# Patient Record
Sex: Female | Born: 1965 | Race: Black or African American | Hispanic: No | Smoking: Never smoker
Health system: Southern US, Community
[De-identification: ages and names within clinical notes are randomized; demographics above are authoritative.]

## PROBLEM LIST (undated history)

## (undated) DIAGNOSIS — R160 Hepatomegaly, not elsewhere classified: Secondary | ICD-10-CM

## (undated) DIAGNOSIS — M1990 Unspecified osteoarthritis, unspecified site: Secondary | ICD-10-CM

## (undated) DIAGNOSIS — Z87442 Personal history of urinary calculi: Secondary | ICD-10-CM

## (undated) DIAGNOSIS — C50919 Malignant neoplasm of unspecified site of unspecified female breast: Secondary | ICD-10-CM

## (undated) DIAGNOSIS — N189 Chronic kidney disease, unspecified: Secondary | ICD-10-CM

## (undated) DIAGNOSIS — C801 Malignant (primary) neoplasm, unspecified: Secondary | ICD-10-CM

## (undated) DIAGNOSIS — F419 Anxiety disorder, unspecified: Secondary | ICD-10-CM

## (undated) HISTORY — DX: Malignant (primary) neoplasm, unspecified: C80.1

## (undated) HISTORY — DX: Malignant neoplasm of unspecified site of unspecified female breast: C50.919

## (undated) HISTORY — DX: Hepatomegaly, not elsewhere classified: R16.0

## (undated) HISTORY — PX: INTRAUTERINE DEVICE INSERTION: SHX323

---

## 2011-07-15 ENCOUNTER — Other Ambulatory Visit: Payer: Self-pay | Admitting: Obstetrics and Gynecology

## 2011-07-15 DIAGNOSIS — R921 Mammographic calcification found on diagnostic imaging of breast: Secondary | ICD-10-CM

## 2011-07-29 ENCOUNTER — Ambulatory Visit
Admission: RE | Admit: 2011-07-29 | Discharge: 2011-07-29 | Disposition: A | Discharge status: Home / Self Care | Payer: Managed Care, Other (non HMO) | Source: Ambulatory Visit | Attending: Obstetrics and Gynecology | Admitting: Obstetrics and Gynecology

## 2011-07-29 ENCOUNTER — Ambulatory Visit
Admission: RE | Admit: 2011-07-29 | Discharge: 2011-07-29 | Disposition: A | Discharge status: Home / Self Care | Payer: Commercial Indemnity | Source: Ambulatory Visit | Attending: Obstetrics and Gynecology | Admitting: Obstetrics and Gynecology

## 2011-07-29 ENCOUNTER — Other Ambulatory Visit: Payer: Self-pay | Admitting: Diagnostic Radiology

## 2011-07-29 DIAGNOSIS — R921 Mammographic calcification found on diagnostic imaging of breast: Secondary | ICD-10-CM

## 2011-07-31 ENCOUNTER — Other Ambulatory Visit: Payer: Self-pay | Admitting: Obstetrics and Gynecology

## 2011-07-31 DIAGNOSIS — C50912 Malignant neoplasm of unspecified site of left female breast: Secondary | ICD-10-CM

## 2011-08-05 ENCOUNTER — Ambulatory Visit
Admission: RE | Admit: 2011-08-05 | Discharge: 2011-08-05 | Disposition: A | Discharge status: Home / Self Care | Payer: Managed Care, Other (non HMO) | Source: Ambulatory Visit | Attending: Obstetrics and Gynecology | Admitting: Obstetrics and Gynecology

## 2011-08-05 DIAGNOSIS — C50912 Malignant neoplasm of unspecified site of left female breast: Secondary | ICD-10-CM

## 2011-08-05 MED ORDER — GADOBENATE DIMEGLUMINE 529 MG/ML IV SOLN
14.0000 mL | Freq: Once | INTRAVENOUS | Status: AC | PRN
  Administered 2011-08-05: 14 mL via INTRAVENOUS

## 2011-08-06 ENCOUNTER — Ambulatory Visit (INDEPENDENT_AMBULATORY_CARE_PROVIDER_SITE_OTHER): Payer: Managed Care, Other (non HMO) | Admitting: Surgery

## 2011-08-06 ENCOUNTER — Encounter (INDEPENDENT_AMBULATORY_CARE_PROVIDER_SITE_OTHER): Payer: Self-pay | Admitting: Surgery

## 2011-08-06 VITALS — BP 100/62 | HR 68 | Temp 97.4°F | Resp 18 | Ht 61.0 in | Wt 157.2 lb

## 2011-08-06 DIAGNOSIS — C50919 Malignant neoplasm of unspecified site of unspecified female breast: Secondary | ICD-10-CM

## 2011-08-06 DIAGNOSIS — C50912 Malignant neoplasm of unspecified site of left female breast: Secondary | ICD-10-CM

## 2011-08-06 DIAGNOSIS — C50412 Malignant neoplasm of upper-outer quadrant of left female breast: Secondary | ICD-10-CM | POA: Insufficient documentation

## 2011-08-06 NOTE — Progress Notes (Signed)
Patient ID: Samantha Mckay, female   DOB: Aug 06, 1965, 46 y.o.   MRN: [id number]  Chief Complaint  Patient presents with  . Other    Eval breast cancer   HPI Samantha Mckay is a 46 y.o. female.  Referred by Dr. 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 Her primary care physician is Dr. Alyce Pagan. HPI This is a healthy 46 year old female who recently underwent her first screening mammogram. This was performed at [facility] in [place]. She was called back in for magnification views on the left. She was then referred to the breast center of [facility] for stereotactic biopsy of suspicious microcalcifications in the left breast. 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 She underwent MRI on 1/9. In the left lower inner quadrant there is patchy, segmental and enhancement measuring 5.1 x 2.3 x 2.1 cm. The biopsy clip is located in the anterior one third of the enhancement. There is no sign of any axillary or internal mammary adenopathy. The right breast is normal. There is incidental finding of a 10.7 cm mass in the liver which may represent hemangioma versus cyst. She is now referred for surgical evaluation.  She denies any breast pain, tenderness, masses, or nipple discharge. Menarche - age 72 1st pregnancy - age 62 Breastfeed - yes Hormones - yes  Past Medical History  Diagnosis Date  . Cancer     breast    History reviewed. No pertinent past surgical history. IUD in place  Family History  Problem Relation Age of Onset  . Cancer Mother     stomach    Social History History  Substance Use Topics  . Smoking status: Never Smoker   . Smokeless tobacco: Never Used  . Alcohol Use: No    No Known Allergies  Current Outpatient Prescriptions  Medication Sig Dispense Refill  . cholecalciferol (VITAMIN D) 1000 UNITS tablet Take 1,000 Units by mouth 2 (two) times daily.      . Multiple Vitamin (MULTIVITAMIN)  tablet Take 1 tablet by mouth daily.        Review of Systems Review of Systems  Constitutional: Negative for fever, chills and unexpected weight change.  HENT: Negative for hearing loss, congestion, sore throat, trouble swallowing and voice change.   Eyes: Negative for visual disturbance.  Respiratory: Negative for cough and wheezing.   Cardiovascular: Negative for chest pain, palpitations and leg swelling.  Gastrointestinal: Negative for nausea, vomiting, abdominal pain, diarrhea, constipation, blood in stool, abdominal distention and anal bleeding.  Genitourinary: Negative for hematuria, vaginal bleeding and difficulty urinating.  Musculoskeletal: Negative for arthralgias.  Skin: Negative for rash and wound.  Neurological: Negative for seizures, syncope and headaches.  Hematological: Negative for adenopathy. Does not bruise/bleed easily.  Psychiatric/Behavioral: Negative for confusion.    Blood pressure 100/62, pulse 68, temperature 97.4 F (36.3 C), temperature source Temporal, resp. rate 18, height 5\' 1"  (1.549 m), weight 157 lb 3.2 oz (71.305 kg).  Physical Exam Physical Exam WDWN in NAD HEENT:  EOMI, sclera anicteric Neck:  No masses, no thyromegaly Lungs:  CTA bilaterally; normal respiratory effort CV:  Regular rate and rhythm; no murmurs Breasts - symmetric; no axillary or supraclavicular lymphadenopathy; no palpable masses in the right breast; left breast - lower inner quadrant just outside the areola - 2 cm area of firmness under the previous biopsy site - this may represent residual hematoma Abd:  +bowel sounds, soft, non-tender, no masses Ext:  Well-perfused; no  edema Skin:  Warm, dry; no sign of jaundice  Data Reviewed Mammogram/ path report/ MRI  Assessment    DCIS left breast in an area of microcalcifications/ patchy enhancement measuring 5.1 x 2.3 x 2.1 cm The biopsy was in the anterior one third of the area of enhancement Liver mass - 10.7 cm - could  represent hemangioma vs cyst; need to evaluate further    Plan    Liver MRI to rule out liver metastases We discussed options for surgical treatment. We discussed breast conserving therapy with a lumpectomy, sentinel lymph node biopsy followed by a course of radiation with possible further adjuvant treatment. Breast conserving therapy might be difficult to have the entire area of microcalcifications were excised. This area measures 5.1 cm in greatest dimension and a lumpectomy with negative margins may leave a less than desirable cosmetic outcome. We also discussed possible mastectomy with possible immediate reconstruction. Even prior to her visit the patient stated that she was leaning in this direction.  We will proceed with her liver MRI. We will also refer her to plastic surgery for consultation for possible immediate reconstruction. If we choose to pursue breast conserving therapy and then we will need to biopsy the posterior portion of these calcifications to determine the extent of the disease. We will reevaluate the patient in one to 2 weeks to discuss further options       Adem Costlow K. 08/06/2011, 12:49 PM

## 2011-08-06 NOTE — Patient Instructions (Signed)
Follow-up in 1-2 weeks to discuss further treatment options.

## 2011-08-07 ENCOUNTER — Other Ambulatory Visit (INDEPENDENT_AMBULATORY_CARE_PROVIDER_SITE_OTHER): Payer: Self-pay

## 2011-08-07 DIAGNOSIS — R160 Hepatomegaly, not elsewhere classified: Secondary | ICD-10-CM

## 2011-08-12 ENCOUNTER — Ambulatory Visit
Admission: RE | Admit: 2011-08-12 | Discharge: 2011-08-12 | Disposition: A | Discharge status: Home / Self Care | Payer: Managed Care, Other (non HMO) | Source: Ambulatory Visit | Attending: Surgery | Admitting: Surgery

## 2011-08-12 ENCOUNTER — Other Ambulatory Visit (INDEPENDENT_AMBULATORY_CARE_PROVIDER_SITE_OTHER): Payer: Self-pay | Admitting: Surgery

## 2011-08-12 ENCOUNTER — Telehealth (INDEPENDENT_AMBULATORY_CARE_PROVIDER_SITE_OTHER): Payer: Self-pay | Admitting: General Surgery

## 2011-08-12 DIAGNOSIS — R160 Hepatomegaly, not elsewhere classified: Secondary | ICD-10-CM

## 2011-08-12 DIAGNOSIS — C50919 Malignant neoplasm of unspecified site of unspecified female breast: Secondary | ICD-10-CM

## 2011-08-12 MED ORDER — GADOBENATE DIMEGLUMINE 529 MG/ML IV SOLN
14.0000 mL | Freq: Once | INTRAVENOUS | Status: AC | PRN
  Administered 2011-08-12: 14 mL via INTRAVENOUS

## 2011-08-12 NOTE — Progress Notes (Signed)
Discussion in Breast Conference 08/12/11 Wide area of suspicious microcalcifications - would likely be difficult to pursue adequate breast conserving therapy.  With mastectomy, she would not likely need radiation therapy, so she would be a candidate for immediate reconstruction.  Liver MRI pending.  Plastic surgery consultation pending.  Wilmon Arms. Corliss Skains, MD, [facility]  08/12/2011 6:55 AM

## 2011-08-12 NOTE — Telephone Encounter (Signed)
Lisa from the [facility] called me and she will set up Samantha Mckay up for a appt for genetics on 08-14-11. If anything else changes she will let me know

## 2011-08-13 ENCOUNTER — Encounter (INDEPENDENT_AMBULATORY_CARE_PROVIDER_SITE_OTHER): Payer: Managed Care, Other (non HMO) | Admitting: Surgery

## 2011-08-14 ENCOUNTER — Ambulatory Visit: Payer: Managed Care, Other (non HMO)

## 2011-08-14 NOTE — Progress Notes (Signed)
Pt. Seen for genetic counseling.  Blood drawn for BRCA 1/2 

## 2011-08-25 ENCOUNTER — Other Ambulatory Visit (INDEPENDENT_AMBULATORY_CARE_PROVIDER_SITE_OTHER): Payer: Self-pay | Admitting: Surgery

## 2011-08-25 ENCOUNTER — Ambulatory Visit (INDEPENDENT_AMBULATORY_CARE_PROVIDER_SITE_OTHER): Payer: Managed Care, Other (non HMO) | Admitting: Surgery

## 2011-08-25 ENCOUNTER — Encounter (INDEPENDENT_AMBULATORY_CARE_PROVIDER_SITE_OTHER): Payer: Self-pay | Admitting: Surgery

## 2011-08-25 VITALS — BP 101/62 | HR 80 | Temp 98.4°F | Resp 14 | Ht 61.0 in | Wt 153.2 lb

## 2011-08-25 DIAGNOSIS — D0510 Intraductal carcinoma in situ of unspecified breast: Secondary | ICD-10-CM

## 2011-08-25 DIAGNOSIS — D0590 Unspecified type of carcinoma in situ of unspecified breast: Secondary | ICD-10-CM

## 2011-08-25 NOTE — Progress Notes (Signed)
She comes back for further discussion.  Genetics are pending.  She had a consultation with Dr. Kelly Splinter and they have decided on immediate reconstruction with bilateral tissue expanders.  Her liver MRI showed only multiple simple cysts, but no complex or worrisome features are noted.    Filed Vitals:   08/25/11 0959  BP: 101/62  Pulse: 80  Temp: 98.4 F (36.9 C)  Resp: 14    We spent some time discussing her bilateral mastectomies.  Certainly, with the extensive area of DCIS in the left breast, I would recommend a left axillary sentinel lymph node biopsy at the time of surgery.  We discussed the possibility of nipple-sparing mastectomy, but I don't feel that she would be a good candidate for NSM on the left.  The area of microcalcifications covers over 5 cm, and the current published criteria for this procedure are limited to areas less than 3 cm.  Certainly, she would be a candidate for NSM on the right, but she has decided to proceed with just standard bilateral simple mastectomies, including excision of the nipple-areolar complex.  The surgical procedure has been discussed with the patient.  Potential risks, benefits, alternative treatments, and expected outcomes have been explained.  All of the patient's questions at this time have been answered.  The likelihood of reaching the patient's treatment goal is good.  The patient understand the proposed surgical procedure and wishes to proceed.  We will coordinate scheduling with Dr. Leonie Green office.  Wilmon Arms. Corliss Skains, MD, [facility]  08/25/2011 11:48 AM

## 2011-08-25 NOTE — Patient Instructions (Signed)
We will call you to schedule the surgery after we have coordinated with Dr. Kelly Splinter.

## 2011-09-08 ENCOUNTER — Encounter (HOSPITAL_COMMUNITY): Payer: Self-pay | Admitting: Pharmacy Technician

## 2011-09-12 NOTE — Pre-Procedure Instructions (Addendum)
20 Samantha Mckay  09/12/2011     Your procedure is scheduled on:  Monday, Feb 25th  Report to [facility] at 9:00 AM.  Call this number if you have problems the morning of surgery: [phone]   Remember:   Do not eat food:After Midnight Sunday.   May have clear liquids: up to 4 Hours before arrival time -- 5:00 AM.  Clear liquids include soda, tea, black coffee, apple or grape juice, broth.   Take these medicines the morning of surgery with A SIP OF WATER: nothing   Do not wear jewelry, make-up or nail polish.   Do not wear lotions, powders, or perfumes. You may wear deodorant.   Do not shave 48 hours prior to surgery.   Do not bring valuables to the hospital.   Contacts, dentures or bridgework may not be worn into surgery.  Leave suitcase in the car. After surgery it may be brought to your room.  For patients admitted to the hospital, checkout time is 11:00 AM the day of discharge.   Patients discharged the day of surgery will not be allowed to drive home.  Name and phone number of your driver:Samantha Mckay [phone]  Special Instructions: CHG Shower Use Special Wash: 1/2 bottle night before surgery and 1/2 bottle morning of surgery.   Please read over the following fact sheets that you were given: Pain Booklet, Coughing and Deep Breathing, MRSA Information and Surgical Site Infection Prevention

## 2011-09-14 ENCOUNTER — Encounter (HOSPITAL_COMMUNITY): Payer: Self-pay

## 2011-09-14 ENCOUNTER — Encounter (HOSPITAL_COMMUNITY)
Admission: RE | Admit: 2011-09-14 | Discharge: 2011-09-14 | Disposition: A | Discharge status: Home / Self Care | Payer: Managed Care, Other (non HMO) | Source: Ambulatory Visit | Attending: Surgery | Admitting: Surgery

## 2011-09-14 LAB — SURGICAL PCR SCREEN
MRSA, PCR: NEGATIVE
Staphylococcus aureus: NEGATIVE

## 2011-09-14 LAB — CBC
HCT: 39.4 % (ref 36.0–46.0)
MCH: 27.6 pg (ref 26.0–34.0)
MCV: 83.7 fL (ref 78.0–100.0)
RDW: 12.8 % (ref 11.5–15.5)
WBC: 3.7 10*3/uL — ABNORMAL LOW (ref 4.0–10.5)

## 2011-09-14 NOTE — Progress Notes (Signed)
Dr sanger's office called for orders. ?julie to return call.

## 2011-09-15 ENCOUNTER — Other Ambulatory Visit: Payer: Self-pay | Admitting: Plastic Surgery

## 2011-09-15 NOTE — Progress Notes (Signed)
Contacted Dr. Leonie Green office for orders. Secretary stated she would let Dr. Kelly Splinter know and get them in EPIC.

## 2011-09-20 MED ORDER — CEFAZOLIN SODIUM-DEXTROSE 2-3 GM-% IV SOLR
2.0000 g | INTRAVENOUS | Status: AC
  Administered 2011-09-21: 2 g via INTRAVENOUS
  Filled 2011-09-20: qty 50

## 2011-09-21 ENCOUNTER — Encounter (HOSPITAL_COMMUNITY)
Admission: RE | Disposition: A | Discharge status: Home / Self Care | Payer: Self-pay | Source: Ambulatory Visit | Attending: Surgery

## 2011-09-21 ENCOUNTER — Inpatient Hospital Stay (HOSPITAL_COMMUNITY)
Admission: RE | Admit: 2011-09-21 | Discharge: 2011-09-23 | DRG: 581 | Disposition: A | Discharge status: Home / Self Care | Payer: Managed Care, Other (non HMO) | Source: Ambulatory Visit | Attending: Surgery | Admitting: Surgery

## 2011-09-21 ENCOUNTER — Encounter (HOSPITAL_COMMUNITY): Payer: Self-pay | Admitting: Plastic Surgery

## 2011-09-21 ENCOUNTER — Encounter (HOSPITAL_COMMUNITY): Payer: Self-pay | Admitting: Certified Registered"

## 2011-09-21 ENCOUNTER — Ambulatory Visit (HOSPITAL_COMMUNITY)
Admission: RE | Admit: 2011-09-21 | Discharge: 2011-09-21 | Disposition: A | Discharge status: Home / Self Care | Payer: Managed Care, Other (non HMO) | Source: Ambulatory Visit | Attending: Surgery | Admitting: Surgery

## 2011-09-21 ENCOUNTER — Ambulatory Visit (HOSPITAL_COMMUNITY): Payer: Managed Care, Other (non HMO) | Admitting: Certified Registered"

## 2011-09-21 DIAGNOSIS — Z4001 Encounter for prophylactic removal of breast: Secondary | ICD-10-CM

## 2011-09-21 DIAGNOSIS — Z01812 Encounter for preprocedural laboratory examination: Secondary | ICD-10-CM

## 2011-09-21 DIAGNOSIS — D0510 Intraductal carcinoma in situ of unspecified breast: Secondary | ICD-10-CM

## 2011-09-21 DIAGNOSIS — Z800 Family history of malignant neoplasm of digestive organs: Secondary | ICD-10-CM

## 2011-09-21 DIAGNOSIS — N6019 Diffuse cystic mastopathy of unspecified breast: Secondary | ICD-10-CM

## 2011-09-21 DIAGNOSIS — C50919 Malignant neoplasm of unspecified site of unspecified female breast: Principal | ICD-10-CM | POA: Diagnosis present

## 2011-09-21 HISTORY — PX: AXILLARY LYMPH NODE DISSECTION: SHX5229

## 2011-09-21 HISTORY — PX: MASTECTOMY W/ SENTINEL NODE BIOPSY: SHX2001

## 2011-09-21 HISTORY — PX: TISSUE EXPANDER PLACEMENT: SHX2530

## 2011-09-21 SURGERY — MASTECTOMY WITH SENTINEL LYMPH NODE BIOPSY
Anesthesia: General | Site: Breast | Laterality: Left | Wound class: Clean

## 2011-09-21 MED ORDER — LACTATED RINGERS IV SOLN
INTRAVENOUS | Status: DC | PRN
  Administered 2011-09-21 (×3): via INTRAVENOUS

## 2011-09-21 MED ORDER — MEPERIDINE HCL 25 MG/ML IJ SOLN: 6.2500 mg | INTRAMUSCULAR | Status: DC | PRN

## 2011-09-21 MED ORDER — MIDAZOLAM HCL 2 MG/2ML IJ SOLN
INTRAMUSCULAR | Status: AC
  Filled 2011-09-21: qty 2

## 2011-09-21 MED ORDER — HYDROMORPHONE HCL PF 1 MG/ML IJ SOLN
1.0000 mg | INTRAMUSCULAR | Status: DC | PRN
  Administered 2011-09-21 – 2011-09-22 (×3): 1 mg via INTRAVENOUS
  Filled 2011-09-21 (×3): qty 1

## 2011-09-21 MED ORDER — ONDANSETRON HCL 4 MG/2ML IJ SOLN
INTRAMUSCULAR | Status: DC | PRN
  Administered 2011-09-21 (×2): 4 mg via INTRAVENOUS

## 2011-09-21 MED ORDER — SUFENTANIL CITRATE 50 MCG/ML IV SOLN
INTRAVENOUS | Status: DC | PRN
  Administered 2011-09-21: 20 ug via INTRAVENOUS
  Administered 2011-09-21 (×4): 10 ug via INTRAVENOUS

## 2011-09-21 MED ORDER — SCOPOLAMINE 1 MG/3DAYS TD PT72
MEDICATED_PATCH | TRANSDERMAL | Status: DC | PRN
  Administered 2011-09-21: 1 via TRANSDERMAL

## 2011-09-21 MED ORDER — MIDAZOLAM HCL 2 MG/2ML IJ SOLN
2.0000 mg | Freq: Once | INTRAMUSCULAR | Status: AC
  Administered 2011-09-21 (×2): 1 mg via INTRAVENOUS

## 2011-09-21 MED ORDER — HYDROMORPHONE HCL PF 1 MG/ML IJ SOLN
INTRAMUSCULAR | Status: AC
  Administered 2011-09-21: 1 mg
  Filled 2011-09-21: qty 1

## 2011-09-21 MED ORDER — ONDANSETRON HCL 4 MG/2ML IJ SOLN: 4.0000 mg | Freq: Four times a day (QID) | INTRAMUSCULAR | Status: DC | PRN

## 2011-09-21 MED ORDER — CEFAZOLIN SODIUM 1-5 GM-% IV SOLN
1.0000 g | Freq: Four times a day (QID) | INTRAVENOUS | Status: AC
  Administered 2011-09-21 – 2011-09-22 (×3): 1 g via INTRAVENOUS
  Filled 2011-09-21 (×2): qty 50

## 2011-09-21 MED ORDER — ONDANSETRON HCL 4 MG PO TABS: 4.0000 mg | ORAL_TABLET | Freq: Four times a day (QID) | ORAL | Status: DC | PRN

## 2011-09-21 MED ORDER — DOCUSATE SODIUM 100 MG PO CAPS
100.0000 mg | ORAL_CAPSULE | Freq: Three times a day (TID) | ORAL | Status: DC
  Filled 2011-09-21 (×2): qty 1

## 2011-09-21 MED ORDER — FENTANYL CITRATE 0.05 MG/ML IJ SOLN
100.0000 ug | Freq: Once | INTRAMUSCULAR | Status: AC
  Administered 2011-09-21 (×2): 50 ug via INTRAVENOUS

## 2011-09-21 MED ORDER — OXYCODONE-ACETAMINOPHEN 5-325 MG PO TABS: 1.0000 | ORAL_TABLET | ORAL | Status: DC | PRN

## 2011-09-21 MED ORDER — ONDANSETRON HCL 4 MG/2ML IJ SOLN: 4.0000 mg | Freq: Once | INTRAMUSCULAR | Status: DC | PRN

## 2011-09-21 MED ORDER — HYDROMORPHONE HCL PF 1 MG/ML IJ SOLN
0.2500 mg | INTRAMUSCULAR | Status: DC | PRN
  Administered 2011-09-21: 0.5 mg via INTRAVENOUS

## 2011-09-21 MED ORDER — PROPOFOL 10 MG/ML IV EMUL
INTRAVENOUS | Status: DC | PRN
  Administered 2011-09-21: 120 mg via INTRAVENOUS

## 2011-09-21 MED ORDER — SODIUM CHLORIDE 0.9 % IR SOLN
Status: DC | PRN
  Administered 2011-09-21: 14:00:00

## 2011-09-21 MED ORDER — ROCURONIUM BROMIDE 100 MG/10ML IV SOLN
INTRAVENOUS | Status: DC | PRN
  Administered 2011-09-21: 50 mg via INTRAVENOUS

## 2011-09-21 MED ORDER — 0.9 % SODIUM CHLORIDE (POUR BTL) OPTIME
TOPICAL | Status: DC | PRN
  Administered 2011-09-21: 1000 mL

## 2011-09-21 MED ORDER — TECHNETIUM TC 99M SULFUR COLLOID FILTERED
1.0000 | Freq: Once | INTRAVENOUS | Status: AC | PRN
  Administered 2011-09-21: 1 via INTRADERMAL

## 2011-09-21 MED ORDER — ENOXAPARIN SODIUM 40 MG/0.4ML ~~LOC~~ SOLN
40.0000 mg | SUBCUTANEOUS | Status: DC
  Administered 2011-09-22 – 2011-09-23 (×2): 40 mg via SUBCUTANEOUS
  Filled 2011-09-21 (×3): qty 0.4

## 2011-09-21 MED ORDER — MORPHINE SULFATE 2 MG/ML IJ SOLN: 0.0500 mg/kg | INTRAMUSCULAR | Status: DC | PRN

## 2011-09-21 MED ORDER — LACTATED RINGERS IV SOLN
INTRAVENOUS | Status: DC
  Administered 2011-09-21: 11:00:00 via INTRAVENOUS

## 2011-09-21 MED ORDER — DEXTROSE 5 % IV SOLN
INTRAVENOUS | Status: DC | PRN
  Administered 2011-09-21: 11:00:00 via INTRAVENOUS

## 2011-09-21 MED ORDER — SODIUM CHLORIDE 0.9 % IJ SOLN
INTRAMUSCULAR | Status: DC | PRN
  Administered 2011-09-21: 12:00:00 via INTRAMUSCULAR

## 2011-09-21 MED ORDER — FENTANYL CITRATE 0.05 MG/ML IJ SOLN
INTRAMUSCULAR | Status: AC
  Filled 2011-09-21: qty 2

## 2011-09-21 MED ORDER — SODIUM CHLORIDE 0.9 % IR SOLN
Status: DC | PRN
  Administered 2011-09-21: 1000 mL

## 2011-09-21 MED ORDER — DROPERIDOL 2.5 MG/ML IJ SOLN
INTRAMUSCULAR | Status: DC | PRN
  Administered 2011-09-21: 0.625 mg via INTRAVENOUS

## 2011-09-21 MED ORDER — DEXAMETHASONE SODIUM PHOSPHATE 4 MG/ML IJ SOLN
INTRAMUSCULAR | Status: DC | PRN
  Administered 2011-09-21: 8 mg via INTRAVENOUS

## 2011-09-21 MED ORDER — SODIUM CHLORIDE 0.9 % IR SOLN
Status: DC | PRN
  Administered 2011-09-21: 12:00:00

## 2011-09-21 MED ORDER — KCL IN DEXTROSE-NACL 20-5-0.45 MEQ/L-%-% IV SOLN
INTRAVENOUS | Status: DC
  Administered 2011-09-21 – 2011-09-22 (×4): via INTRAVENOUS
  Filled 2011-09-21 (×5): qty 1000

## 2011-09-21 SURGICAL SUPPLY — 81 items
ALLODERM TISSUE MATRIX 128SQCM (Tissue) ×3 IMPLANT
APPLIER CLIP 9.375 MED OPEN (MISCELLANEOUS) ×6
BAG DECANTER FOR FLEXI CONT (MISCELLANEOUS) ×6 IMPLANT
BINDER BREAST LRG (GAUZE/BANDAGES/DRESSINGS) ×3 IMPLANT
BINDER BREAST XLRG (GAUZE/BANDAGES/DRESSINGS) IMPLANT
BIOPATCH RED 1 DISK 7.0 (GAUZE/BANDAGES/DRESSINGS) ×9 IMPLANT
CANISTER SUCTION 2500CC (MISCELLANEOUS) ×3 IMPLANT
CHLORAPREP W/TINT 26ML (MISCELLANEOUS) ×6 IMPLANT
CLIP APPLIE 9.375 MED OPEN (MISCELLANEOUS) ×4 IMPLANT
CLOTH BEACON ORANGE TIMEOUT ST (SAFETY) ×6 IMPLANT
CONT SPEC 4OZ CLIKSEAL STRL BL (MISCELLANEOUS) ×3 IMPLANT
COVER PROBE W GEL 5X96 (DRAPES) ×3 IMPLANT
COVER SURGICAL LIGHT HANDLE (MISCELLANEOUS) ×6 IMPLANT
DERMABOND ADVANCED (GAUZE/BANDAGES/DRESSINGS) ×2
DERMABOND ADVANCED .7 DNX12 (GAUZE/BANDAGES/DRESSINGS) ×4 IMPLANT
DRAIN CHANNEL 19F RND (DRAIN) ×9 IMPLANT
DRAPE LAPAROSCOPIC ABDOMINAL (DRAPES) IMPLANT
DRAPE ORTHO SPLIT 77X108 STRL (DRAPES) ×2
DRAPE PROXIMA HALF (DRAPES) ×6 IMPLANT
DRAPE SURG ORHT 6 SPLT 77X108 (DRAPES) ×4 IMPLANT
DRAPE UTILITY 15X26 W/TAPE STR (DRAPE) ×6 IMPLANT
DRAPE WARM FLUID 44X44 (DRAPE) ×3 IMPLANT
DRSG PAD ABDOMINAL 8X10 ST (GAUZE/BANDAGES/DRESSINGS) ×3 IMPLANT
ELECT BLADE 4.0 EZ CLEAN MEGAD (MISCELLANEOUS) ×3
ELECT CAUTERY BLADE 6.4 (BLADE) ×3 IMPLANT
ELECT REM PT RETURN 9FT ADLT (ELECTROSURGICAL) ×3
ELECTRODE BLDE 4.0 EZ CLN MEGD (MISCELLANEOUS) ×2 IMPLANT
ELECTRODE REM PT RTRN 9FT ADLT (ELECTROSURGICAL) ×2 IMPLANT
EVACUATOR SILICONE 100CC (DRAIN) ×9 IMPLANT
GLOVE BIO SURGEON STRL SZ 6.5 (GLOVE) ×6 IMPLANT
GLOVE BIO SURGEON STRL SZ7 (GLOVE) ×3 IMPLANT
GLOVE BIO SURGEON STRL SZ7.5 (GLOVE) ×3 IMPLANT
GLOVE BIOGEL PI IND STRL 7.0 (GLOVE) ×8 IMPLANT
GLOVE BIOGEL PI IND STRL 7.5 (GLOVE) ×4 IMPLANT
GLOVE BIOGEL PI IND STRL 8 (GLOVE) ×2 IMPLANT
GLOVE BIOGEL PI INDICATOR 7.0 (GLOVE) ×4
GLOVE BIOGEL PI INDICATOR 7.5 (GLOVE) ×2
GLOVE BIOGEL PI INDICATOR 8 (GLOVE) ×1
GLOVE ECLIPSE 6.5 STRL STRAW (GLOVE) ×3 IMPLANT
GLOVE SS BIOGEL STRL SZ 7.5 (GLOVE) ×2 IMPLANT
GLOVE SUPERSENSE BIOGEL SZ 7.5 (GLOVE) ×1
GLOVE SURG SS PI 6.5 STRL IVOR (GLOVE) ×9 IMPLANT
GOWN PREVENTION PLUS XLARGE (GOWN DISPOSABLE) ×6 IMPLANT
GOWN STRL NON-REIN LRG LVL3 (GOWN DISPOSABLE) ×21 IMPLANT
KIT BASIN OR (CUSTOM PROCEDURE TRAY) ×6 IMPLANT
KIT ROOM TURNOVER OR (KITS) ×3 IMPLANT
MENTOR ×3 IMPLANT
MENTOR (Breast) ×3 IMPLANT
NEEDLE 18GX1X1/2 (RX/OR ONLY) (NEEDLE) ×3 IMPLANT
NEEDLE HYPO 25GX1X1/2 BEV (NEEDLE) ×3 IMPLANT
NS IRRIG 1000ML POUR BTL (IV SOLUTION) ×9 IMPLANT
PACK GENERAL/GYN (CUSTOM PROCEDURE TRAY) ×6 IMPLANT
PAD ARMBOARD 7.5X6 YLW CONV (MISCELLANEOUS) ×3 IMPLANT
PEN SKIN MARKING BROAD (MISCELLANEOUS) ×3 IMPLANT
PIN SAFETY STERILE (MISCELLANEOUS) ×3 IMPLANT
SET COLLECT BLD 21X3/4 12 PB (MISCELLANEOUS) ×3 IMPLANT
SPECIMEN JAR LARGE (MISCELLANEOUS) IMPLANT
SPECIMEN JAR MEDIUM (MISCELLANEOUS) ×3 IMPLANT
SPECIMEN JAR X LARGE (MISCELLANEOUS) ×6 IMPLANT
SPONGE GAUZE 4X4 12PLY (GAUZE/BANDAGES/DRESSINGS) ×6 IMPLANT
SPONGE INTESTINAL PEANUT (DISPOSABLE) ×3 IMPLANT
STAPLER VISISTAT 35W (STAPLE) IMPLANT
SUT ETHILON 2 0 FS 18 (SUTURE) IMPLANT
SUT ETHILON 3 0 FSL (SUTURE) IMPLANT
SUT MON AB 5-0 PS2 18 (SUTURE) ×6 IMPLANT
SUT PDS AB 2-0 CT1 27 (SUTURE) ×12 IMPLANT
SUT PDS AB 3-0 SH 27 (SUTURE) ×6 IMPLANT
SUT SILK 2 0 SH (SUTURE) ×6 IMPLANT
SUT SILK 3 0 SH 30 (SUTURE) ×6 IMPLANT
SUT VIC AB 3-0 SH 18 (SUTURE) ×3 IMPLANT
SUT VIC AB 3-0 SH 27 (SUTURE) ×4
SUT VIC AB 3-0 SH 27X BRD (SUTURE) ×8 IMPLANT
SUT VIC AB 4-0 SH 18 (SUTURE) ×3 IMPLANT
SUT VICRYL 4-0 PS2 18IN ABS (SUTURE) ×12 IMPLANT
SUT VICRYL AB 3 0 TIES (SUTURE) ×3 IMPLANT
SYR CONTROL 10ML LL (SYRINGE) ×3 IMPLANT
TOWEL OR 17X24 6PK STRL BLUE (TOWEL DISPOSABLE) ×6 IMPLANT
TOWEL OR 17X26 10 PK STRL BLUE (TOWEL DISPOSABLE) ×3 IMPLANT
TRAY FOLEY CATH 14FRSI W/METER (CATHETERS) ×3 IMPLANT
TUBE CONNECTING 12X1/4 (SUCTIONS) ×3 IMPLANT
YANKAUER SUCT BULB TIP NO VENT (SUCTIONS) ×3 IMPLANT

## 2011-09-21 NOTE — Brief Op Note (Signed)
09/21/2011  11:36 AM  PATIENT:  Samantha Mckay  46 y.o. female  PRE-OPERATIVE DIAGNOSIS:  Left breast cancer  POST-OPERATIVE DIAGNOSIS:  Left breast cancer  PROCEDURE:  Procedure(s) (LRB): BILATERAL IMMEDIATE BREAST RECONSTRUCTION WITH EXPANDER AND ALLODERM PLACEMENT MASTECTOMY WITH SENTINEL LYMPH NODE BIOPSY (Bilateral) by general surgery  SURGEON:  Surgeon(s) and Role: Panel 1:    Wilmon Arms. Corliss Skains, MD - Primary  Panel 2:    Alan Ripper Sanger, DO - Primary  PHYSICIAN ASSISTANT: none  ASSISTANTS: none   ANESTHESIA:   general  EBL:50cc     BLOOD ADMINISTERED:none  DRAINS: (2) Jackson-Pratt drain(s) with closed bulb suction in the breast pocket on each side   LOCAL MEDICATIONS USED:  NONE  SPECIMEN:  No Specimen  DISPOSITION OF SPECIMEN:  N/A  COUNTS:  YES  TOURNIQUET:  * No tourniquets in log *  DICTATION: dictated  PLAN OF CARE: Admit for overnight observation  PATIENT DISPOSITION:  PACU - hemodynamically stable.   Delay start of Pharmacological VTE agent (>24hrs) due to surgical blood loss or risk of bleeding: no

## 2011-09-21 NOTE — Brief Op Note (Signed)
09/21/2011  2:01 PM  PATIENT:  Samantha Mckay  46 y.o. female  PRE-OPERATIVE DIAGNOSIS:  Left breast cancer - DCIS  POST-OPERATIVE DIAGNOSIS:  Left breast cancer - invasive ductal  PROCEDURE:  Procedure(s) (LRB): Bilateral MASTECTOMY WITH Left axillary lymph node dissection (Bilateral) TISSUE EXPANDER (Bilateral)  SURGEON:  Surgeon(s) and Role: Panel 1:    * Mariella Saa, MD - Assisting    Wilmon Arms. Corliss Skains, MD - Primary  Panel 2:    Alan Ripper Sanger, DO - Primary   ANESTHESIA:   general  EBL:  Total I/O In: 50 [I.V.:50] Out: 50 [Urine:50]  BLOOD ADMINISTERED:none  DRAINS: (3) Jackson-Pratt drain(s) with closed bulb suction in the left axilla and bilateral tissue expander sites   LOCAL MEDICATIONS USED:  NONE  SPECIMEN:  Source of Specimen:  right mastectomy Left sentinel lymph node biopsy Left mastectomy Left axillary contents   DISPOSITION OF SPECIMEN:  PATHOLOGY  COUNTS:  YES  TOURNIQUET:  * No tourniquets in log *  DICTATION: .Dragon Dictation  PLAN OF CARE: Admit to inpatient   PATIENT DISPOSITION:  PACU - hemodynamically stable.   Delay start of Pharmacological VTE agent (>24hrs) due to surgical blood loss or risk of bleeding: no

## 2011-09-21 NOTE — Anesthesia Postprocedure Evaluation (Signed)
  Anesthesia Post-op Note  Patient: Samantha Mckay  Procedure(s) Performed: Procedure(s) (LRB): MASTECTOMY WITH SENTINEL LYMPH NODE BIOPSY (Bilateral) TISSUE EXPANDER (Bilateral) AXILLARY LYMPH NODE DISSECTION (Left)  Patient Location: PACU  Anesthesia Type: General  Level of Consciousness: awake, alert  and oriented  Airway and Oxygen Therapy: Patient Spontanous Breathing and Patient connected to nasal cannula oxygen  Post-op Pain: mild  Post-op Assessment: Post-op Vital signs reviewed, Patient's Cardiovascular Status Stable, Respiratory Function Stable, Patent Airway, No signs of Nausea or vomiting and Pain level controlled  Post-op Vital Signs: Reviewed and stable  Complications: No apparent anesthesia complications

## 2011-09-21 NOTE — Preoperative (Signed)
Beta Blockers   Reason not to administer Beta Blockers:Not Applicable 

## 2011-09-21 NOTE — Op Note (Signed)
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 Assistant:  [place]oxworth Anesthesia:  GETT Indications:  This is a 46 yo female who recently underwent her initial screening mammogram.  She was found to have a 5 cm area of suspicious calcifications, which were biopsied and showed DCIS.  MRI showed no other worrisome findings.  Due to her young age, she was referred for genetics evaluation, which is positive for BRCA (see report for details).  She was referred to Plastic Surgery and she has decided on bilateral mastectomies with placement of tissue expanders by Dr. Kelly Splinter.  Description of procedure:  The patient was brought to the operating room and placed in a supine position on the operating room table.  After an adequate level of anesthesia was obtained, a Foley catheter was placed under sterile technique. Her chest and both axillae were prepped with Chloraprep and draped in sterile fashion.  A time out was taken.  We began on her right side.  I outlined an elliptical incision to include her nipple-areolar complex.  We made the skin incision with a scalpel and raised skin flaps with cautery up to the infraclavicular chest wall, the inframammary crease, the edge of the sternum, and the latissimus dorsi muscle laterally.  The breast tissue was then removed from the pectoralis muscle from medial to lateral.  Hemostasis was obtained with cautery.  We then turned our attention to the left side.  Prior to surgery, the patient had been injected with technetium sulfur-colloid by the nuclear medicine technologist.  I had also injected her nipple intradermally with methylene blue solution and massaged for a few minutes.  We made a similar elliptical incision and raised skin flaps  superiorly.  As we entered the axilla, I could clearly see blue lymphatics.  We used the neoprobe to localize the sentinel lymph node.  Blunt dissection was used to isolate the sentinel lymph node,and the vessels were ligated with clips.  The sentinel node was removed and showed an ex vivo count of 3000.  This was sent for immediate touch prep.  While we were waiting for the touch prep, we completed the left mastectomy by raising the inferior skin flaps and removing the breast tissue from the pectoralis muscle.  We inspected carefully for hemostasis.  At this point, I received a call from the pathologist.  The sentinel lymph node that we had submitted was actually three nodes.  Two were negative, but one was suspicious for metastatic invasive breast cancer.  He performed a frozen section which confirmed that this node did contain metastatic breast cancer.  We then made the decision to perform a completion lymph node dissection on the left.  I bluntly dissected in the axilla until we could identify the long thoracic nerve, the thoracodorsal bundle, and the axillary vein.  The axillary contents were dissected free enbloc and several small vessels were ligated with clips.  The axillary contents were then sent for pathologic examination.  We irrigated the left surgical site and inspected carefully for hemostasis.  The case was then turned over to Dr. Kelly Splinter for tissue expander placement.  Wilmon Arms. Corliss Skains, MD, [facility] Surgery  09/21/2011 8:39 PM

## 2011-09-21 NOTE — Anesthesia Preprocedure Evaluation (Signed)
Anesthesia Evaluation  Patient identified by MRN, date of birth, ID band Patient awake    Reviewed: Allergy & Precautions, H&P , NPO status , Patient's Chart, lab work & pertinent test results  Airway Mallampati: I TM Distance: >3 FB Neck ROM: Full    Dental  (+) Teeth Intact and Dental Advisory Given   Pulmonary  clear to auscultation        Cardiovascular Regular Normal    Neuro/Psych    GI/Hepatic   Endo/Other    Renal/GU      Musculoskeletal   Abdominal   Peds  Hematology   Anesthesia Other Findings   Reproductive/Obstetrics                           Anesthesia Physical Anesthesia Plan  ASA: II  Anesthesia Plan: General   Post-op Pain Management:    Induction: Intravenous  Airway Management Planned: Oral ETT  Additional Equipment:   Intra-op Plan:   Post-operative Plan: Extubation in OR  Informed Consent: I have reviewed the patients History and Physical, chart, labs and discussed the procedure including the risks, benefits and alternatives for the proposed anesthesia with the patient or authorized representative who has indicated his/her understanding and acceptance.   Dental advisory given  Plan Discussed with: CRNA, Anesthesiologist and Surgeon  Anesthesia Plan Comments:         Anesthesia Quick Evaluation  

## 2011-09-21 NOTE — OR Nursing (Signed)
Patient interviewed by Darrel Reach, RN.

## 2011-09-21 NOTE — H&P (Signed)
History and Physical  [facility]   09/15/2011 11:15 AM Office Visit  MRN: [id number]  Department:  Plastic Surgery  Dept Phone: [phone]  Description: Female DOB: 08/23/1965  Provider: Wayland Denis, DO     Diagnoses  -  CA - breast cancer   - Primary   174.9     Reason for Visit  -  Breast Reconstruction    H &P; SX 09/21/11         Subjective:     Patient ID: Samantha Mckay is a 46 y.o. female.  HPI Samantha Mckay is a 46 y.o. bf here for a history and physical for immediate bilateral breast reconstruction with expander and Alloderm. She was diagnosed with left breast DCIS. She is seeing Dr. Corliss Skains. A screening mammogram detected a suspicious lesion. 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 She is currently 5 feet 1 inch and 154 pounds. She is currently a 34 B but would like to be a D size but understands we will aim for a B or C cup size.   The following portions of the patient's history were reviewed and updated as appropriate: allergies, current medications, past family history, past medical history, past social history, past surgical history and problem list.  Review of Systems  Constitutional: Negative.   HENT: Negative.   Eyes: Negative.   Respiratory: Negative.   Cardiovascular: Negative.   Gastrointestinal: Negative.   Genitourinary: Negative.   Musculoskeletal: Negative.   Neurological: Negative.   Hematological: Negative.   Psychiatric/Behavioral: Negative.       Objective:    Physical Exam  Constitutional: She appears well-developed and well-nourished.  HENT:   Head: Normocephalic and atraumatic.  Right Ear: External ear normal.  Left Ear: External ear normal.  Eyes: Conjunctivae and EOM are normal. Pupils are equal, round, and reactive to light.  Neck: Normal range of motion.  Cardiovascular: Normal rate.   Pulmonary/Chest: Effort normal.  Abdominal: Soft. She exhibits no distension.  There is no tenderness.  Musculoskeletal: Normal range of motion.  Neurological: She is alert.  Skin: Skin is warm.  Psychiatric: She has a normal mood and affect. Judgment and thought content normal.    Assessment:  1.  CA - breast cancer     Plan:  We are planning on immediate bilateral breast reconstruction with expander and Alloderm placement. Consent was signed and risks and complications were reviewed and include bleeding, pain, scar, risk of anesthesia, infection and capsular contracture.  No change in history or physical.

## 2011-09-21 NOTE — Progress Notes (Signed)
Spoke with [facility] in [place]. Med. , made them aware of schedule, for them to meet pt. In holding

## 2011-09-21 NOTE — Transfer of Care (Signed)
Immediate Anesthesia Transfer of Care Note  Patient: Samantha Mckay  Procedure(s) Performed: Procedure(s) (LRB): MASTECTOMY WITH SENTINEL LYMPH NODE BIOPSY (Bilateral) TISSUE EXPANDER (Bilateral) AXILLARY LYMPH NODE DISSECTION (Left)  Patient Location: PACU  Anesthesia Type: General  Level of Consciousness: awake, alert  and oriented  Airway & Oxygen Therapy: Patient Spontanous Breathing and Patient connected to nasal cannula oxygen  Post-op Assessment: Report given to PACU RN, Post -op Vital signs reviewed and stable and Patient moving all extremities  Post vital signs: Reviewed and stable  Complications: No apparent anesthesia complications

## 2011-09-21 NOTE — Anesthesia Procedure Notes (Signed)
Procedure Name: Intubation Date/Time: 09/21/2011 11:27 AM Performed by: Ellin Goodie Pre-anesthesia Checklist: Patient identified, Emergency Drugs available, Suction available, Patient being monitored and Timeout performed Patient Re-evaluated:Patient Re-evaluated prior to inductionOxygen Delivery Method: Circle system utilized Preoxygenation: Pre-oxygenation with 100% oxygen Intubation Type: IV induction Ventilation: Mask ventilation without difficulty Laryngoscope Size: Mac and 3 Grade View: Grade I Tube type: Oral Tube size: 7.5 mm Number of attempts: 1 Airway Equipment and Method: Stylet Placement Confirmation: ETT inserted through vocal cords under direct vision,  positive ETCO2 and breath sounds checked- equal and bilateral Secured at: 21 cm Tube secured with: Tape Dental Injury: Teeth and Oropharynx as per pre-operative assessment

## 2011-09-21 NOTE — H&P (Signed)
Chief Complaint   Patient presents with   .  Other       Eval breast cancer      HPI Samantha Mckay is a 46 y.o. female.  Referred by Dr. 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 Her primary care physician is Dr. Alyce Mckay. HPI This is a healthy 46 year old female who recently underwent her first screening mammogram. This was performed at [facility] in [place]. She was called back in for magnification views on the left. She was then referred to the breast center of [facility] for stereotactic biopsy of suspicious microcalcifications in the left breast. 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 She underwent MRI on 1/9. In the left lower inner quadrant there is patchy, segmental and enhancement measuring 5.1 x 2.3 x 2.1 cm. The biopsy clip is located in the anterior one third of the enhancement. There is no sign of any axillary or internal mammary adenopathy. The right breast is normal. There is incidental finding of a 10.7 cm mass in the liver which may represent hemangioma versus cyst. She is now referred for surgical evaluation.   She denies any breast pain, tenderness, masses, or nipple discharge. Menarche - age 8 1st pregnancy - age 44 Breastfeed - yes Hormones - yes    Past Medical History   Diagnosis  Date   .  Cancer         breast        History reviewed. No pertinent past surgical history. IUD in place    Family History   Problem  Relation  Age of Onset   .  Cancer  Mother         stomach        Social History History   Substance Use Topics   .  Smoking status:  Never Smoker    .  Smokeless tobacco:  Never Used   .  Alcohol Use:  No        No Known Allergies    Current Outpatient Prescriptions   Medication  Sig  Dispense  Refill   .  cholecalciferol (VITAMIN D) 1000 UNITS tablet  Take 1,000 Units by mouth 2 (two) times daily.         .  Multiple Vitamin (MULTIVITAMIN)  tablet  Take 1 tablet by mouth daily.              Review of Systems Review of Systems  Constitutional: Negative for fever, chills and unexpected weight change.  HENT: Negative for hearing loss, congestion, sore throat, trouble swallowing and voice change.   Eyes: Negative for visual disturbance.  Respiratory: Negative for cough and wheezing.   Cardiovascular: Negative for chest pain, palpitations and leg swelling.  Gastrointestinal: Negative for nausea, vomiting, abdominal pain, diarrhea, constipation, blood in stool, abdominal distention and anal bleeding.  Genitourinary: Negative for hematuria, vaginal bleeding and difficulty urinating.  Musculoskeletal: Negative for arthralgias.  Skin: Negative for rash and wound.  Neurological: Negative for seizures, syncope and headaches.  Hematological: Negative for adenopathy. Does not bruise/bleed easily.  Psychiatric/Behavioral: Negative for confusion.      Blood pressure 100/62, pulse 68, temperature 97.4 F (36.3 C), temperature source Temporal, resp. rate 18, height 5\' 1"  (1.549 m), weight 157 lb 3.2 oz (71.305 kg).   Physical Exam Physical Exam WDWN in NAD HEENT:  EOMI, sclera anicteric Neck:  No masses, no thyromegaly Lungs:  CTA bilaterally; normal respiratory effort CV:  Regular rate and rhythm; no murmurs Breasts - symmetric; no axillary or supraclavicular lymphadenopathy; no palpable masses in the right breast; left breast - lower inner quadrant just outside the areola - 2 cm area of firmness under the previous biopsy site - this may represent residual hematoma Abd:  +bowel sounds, soft, non-tender, no masses Ext:  Well-perfused; no edema Skin:  Warm, dry; no sign of jaundice   Data Reviewed Mammogram/ path report/ MRI   Assessment    DCIS left breast in an area of microcalcifications/ patchy enhancement measuring 5.1 x 2.3 x 2.1 cm The biopsy was in the anterior one third of the area of enhancement Liver mass -  10.7 cm - could represent hemangioma vs cyst; need to evaluate further     Plan    Liver MRI to rule out liver metastases We discussed options for surgical treatment. We discussed breast conserving therapy with a lumpectomy, sentinel lymph node biopsy followed by a course of radiation with possible further adjuvant treatment. Breast conserving therapy might be difficult to have the entire area of microcalcifications were excised. This area measures 5.1 cm in greatest dimension and a lumpectomy with negative margins may leave a less than desirable cosmetic outcome. We also discussed possible mastectomy with possible immediate reconstruction. Even prior to her visit the patient stated that she was leaning in this direction.   We will proceed with her liver MRI. We will also refer her to plastic surgery for consultation for possible immediate reconstruction. If we choose to pursue breast conserving therapy and then we will need to biopsy the posterior portion of these calcifications to determine the extent of the disease. We will reevaluate the patient in one to 2 weeks to discuss further options                Progress Notes Samantha Mckay (MR# [id number])         Progress Notes Info       Author  Note Status  Last Update User  Last Update Date/Time    Samantha Arms. Corliss Skains, MD  Signed  Samantha Arms. Samantha Santori, MD  08/25/2011 11:49 AM          Progress Notes     She comes back for further discussion. Genetics are pending. She had a consultation with Dr. Kelly Mckay and they have decided on immediate reconstruction with bilateral tissue expanders. Her liver MRI showed only multiple simple cysts, but no complex or worrisome features are noted.                                  We spent some time discussing her bilateral mastectomies. Certainly, with the extensive area of DCIS in the left breast, I would recommend a left axillary sentinel lymph node biopsy at the time of surgery. We  discussed the possibility of nipple-sparing mastectomy, but I don't feel that she would be a good candidate for NSM on the left. The area of microcalcifications covers over 5 cm, and the current published criteria for this procedure are limited to areas less than 3 cm. Certainly, she would be a candidate for NSM on the right, but she has decided to proceed with just standard bilateral simple mastectomies, including excision of the nipple-areolar complex. The surgical procedure has been discussed with the patient. Potential risks, benefits, alternative treatments, and expected outcomes have been explained. All of the patient's questions at this time have been  answered. The likelihood of reaching the patient's treatment goal is good. The patient understand the proposed surgical procedure and wishes to proceed.  We will coordinate scheduling with Dr. Leonie Green office.               Samantha Arms. Corliss Skains, MD, [facility]  09/21/2011 7:40 AM

## 2011-09-22 DIAGNOSIS — C50919 Malignant neoplasm of unspecified site of unspecified female breast: Secondary | ICD-10-CM

## 2011-09-22 HISTORY — DX: Malignant neoplasm of unspecified site of unspecified female breast: C50.919

## 2011-09-22 LAB — BASIC METABOLIC PANEL WITH GFR
BUN: 8 mg/dL (ref 6–23)
CO2: 23 meq/L (ref 19–32)
Calcium: 8.4 mg/dL (ref 8.4–10.5)
Chloride: 104 meq/L (ref 96–112)
Creatinine, Ser: 0.71 mg/dL (ref 0.50–1.10)
GFR calc Af Amer: >90 mL/min
GFR calc non Af Amer: >90 mL/min
Glucose, Bld: 135 mg/dL — ABNORMAL HIGH (ref 70–99)
Potassium: 3.9 meq/L (ref 3.5–5.1)
Sodium: 135 meq/L (ref 135–145)

## 2011-09-22 LAB — CBC
Hemoglobin: 11.4 g/dL — ABNORMAL LOW (ref 12.0–15.0)
MCV: 84.4 fL (ref 78.0–100.0)
Platelets: 178 10*3/uL (ref 150–400)
RBC: 4.18 MIL/uL (ref 3.87–5.11)
WBC: 8.5 10*3/uL (ref 4.0–10.5)

## 2011-09-22 MED ORDER — OXYCODONE-ACETAMINOPHEN 5-325 MG PO TABS
1.0000 | ORAL_TABLET | ORAL | Status: DC | PRN
  Administered 2011-09-22 – 2011-09-23 (×4): 2 via ORAL
  Filled 2011-09-22 (×4): qty 2

## 2011-09-22 NOTE — Progress Notes (Signed)
UR of chart complete.  

## 2011-09-22 NOTE — Op Note (Signed)
NAMEMarland Kitchen  Samantha Mckay, Samantha Mckay NO.:  [id number]  MEDICAL RECORD NO.:  [id number]  LOCATION:  5122                         FACILITY:  [facility]  PHYSICIAN:  Wayland Denis, DO      DATE OF BIRTH:  15-Dec-1965  DATE OF PROCEDURE:  09/21/2011 DATE OF DISCHARGE:                              OPERATIVE REPORT   PREOPERATIVE DIAGNOSIS:  Left breast cancer.  POSTOPERATIVE DIAGNOSIS:  Left breast cancer.  PROCEDURE:  Bilateral immediate breast reconstruction with expander placement and AlloDerm.  ATTENDING:  Wayland Denis, DO  ANESTHESIA:  Camila Li, DO     CS/MEDQ  D:  09/21/2011  T:  09/22/2011  Job:  [id number]

## 2011-09-22 NOTE — Progress Notes (Signed)
1 Day Post-Op  Subjective: Patient is s/p bilateral mastectomies with immediate reconstruction.  No complaints this morning.  Objective: Vital signs in last 24 hours: Temp:  [97 F (36.1 C)-99.9 F (37.7 C)] 99.6 F (37.6 C) (02/26 0616) Pulse Rate:  [62-89] 79  (02/26 0616) Resp:  [10-20] 18  (02/26 0616) BP: (100-124)/(61-81) 112/70 mmHg (02/26 0616) SpO2:  [98 %-100 %] 98 % (02/26 0616) Last BM Date: 09/20/11  Intake/Output from previous day: 02/25 0701 - 02/26 0700 In: 2510 [P.O.:60; I.V.:2450] Out: 819.5 [Urine:450; Drains:269.5; Blood:100] Intake/Output this shift: Total I/O In: -  Out: 435 [Urine:300; Drains:135]  General appearance: alert, cooperative and no distress Incision/Wound:flaps are warm and have good capillary refill. Drains in place with good output as expected.  Lab Results:  No results found for this basename: WBC:2,HGB:2,HCT:2,PLT:2 in the last 72 hours BMET No results found for this basename: NA:2,K:2,CL:2,CO2:2,GLUCOSE:2,BUN:2,CREATININE:2,CALCIUM:2 in the last 72 hours PT/INR No results found for this basename: LABPROT:2,INR:2 in the last 72 hours ABG No results found for this basename: PHART:2,PCO2:2,PO2:2,HCO3:2 in the last 72 hours  Studies/Results: Nm Sentinel Node Inj-no Rpt (breast)  09/21/2011  CLINICAL DATA: Left breast DCIS - mastectomy   Sulfur colloid was injected intradermally by the nuclear medicine  technologist for breast cancer sentinel node localization.      Anti-infectives: Anti-infectives     Start     Dose/Rate Route Frequency Ordered Stop   09/21/11 1600   ceFAZolin (ANCEF) IVPB 1 g/50 mL premix        1 g 100 mL/hr over 30 Minutes Intravenous Every 6 hours 09/21/11 1557 09/22/11 0443   09/21/11 1342   polymyxin B 500,000 Units, bacitracin 50,000 Units in sodium chloride irrigation 0.9 % 500 mL irrigation  Status:  Discontinued          As needed 09/21/11 1342 09/21/11 1531   09/21/11 1211   polymyxin B 500,000 Units,  bacitracin 50,000 Units in sodium chloride irrigation 0.9 % 500 mL irrigation  Status:  Discontinued          As needed 09/21/11 1212 09/21/11 1531   09/20/11 0930   ceFAZolin (ANCEF) IVPB 2 g/50 mL premix        2 g 100 mL/hr over 30 Minutes Intravenous 60 min pre-op 09/20/11 0920 09/21/11 1115          Assessment/Plan: s/p Procedure(s) (LRB): MASTECTOMY WITH SENTINEL LYMPH NODE BIOPSY (Bilateral) TISSUE EXPANDER (Bilateral) AXILLARY LYMPH NODE DISSECTION (Left) Plan for discharge tomorrow. Increase activity.  LOS: 1 day    [facility] 09/22/2011

## 2011-09-22 NOTE — Progress Notes (Signed)
1 Day Post-Op  Subjective: Patient is more comfortable today.    I spent some time explaining the intra-operative findings of a positive sentinel lymph node and the decision to proceed with a left axillary lymph node dissection.  Pre-operatively, our biopsies showed only DCIS, but obviously there is some invasive tumor within the left breast with some metastases to the axilla.  Pathology is pending.  Objective: Vital signs in last 24 hours: Temp:  [97 F (36.1 C)-99.9 F (37.7 C)] 99.6 F (37.6 C) (02/26 0616) Pulse Rate:  [62-89] 79  (02/26 0616) Resp:  [10-20] 18  (02/26 0616) BP: (100-124)/(61-81) 112/70 mmHg (02/26 0616) SpO2:  [98 %-100 %] 98 % (02/26 0616) Weight:  [154 lb 1.6 oz (69.899 kg)] 154 lb 1.6 oz (69.899 kg) (02/26 0616) Last BM Date: 09/20/11  Intake/Output from previous day: 02/25 0701 - 02/26 0700 In: 2510 [P.O.:60; I.V.:2450] Out: 819.5 [Urine:450; Drains:269.5; Blood:100] Intake/Output this shift:    General appearance: alert, cooperative and no distress Chest wall: no tenderness, appropriate tenderness; viable skin flaps Drains - serosanguinous drainage; no clots  Lab Results:   Basename 09/22/11 0647  WBC 8.5  HGB 11.4*  HCT 35.3*  PLT 178   BMET  Basename 09/22/11 0647  NA 135  K 3.9  CL 104  CO2 23  GLUCOSE 135*  BUN 8  CREATININE 0.71  CALCIUM 8.4   PT/INR No results found for this basename: LABPROT:2,INR:2 in the last 72 hours ABG No results found for this basename: PHART:2,PCO2:2,PO2:2,HCO3:2 in the last 72 hours  Studies/Results: Nm Sentinel Node Inj-no Rpt (breast)  09/21/2011  CLINICAL DATA: Left breast DCIS - mastectomy   Sulfur colloid was injected intradermally by the nuclear medicine  technologist for breast cancer sentinel node localization.      Anti-infectives: Anti-infectives     Start     Dose/Rate Route Frequency Ordered Stop   09/21/11 1600   ceFAZolin (ANCEF) IVPB 1 g/50 mL premix        1 g 100 mL/hr over 30  Minutes Intravenous Every 6 hours 09/21/11 1557 09/22/11 0443   09/21/11 1342   polymyxin B 500,000 Units, bacitracin 50,000 Units in sodium chloride irrigation 0.9 % 500 mL irrigation  Status:  Discontinued          As needed 09/21/11 1342 09/21/11 1531   09/21/11 1211   polymyxin B 500,000 Units, bacitracin 50,000 Units in sodium chloride irrigation 0.9 % 500 mL irrigation  Status:  Discontinued          As needed 09/21/11 1212 09/21/11 1531   09/20/11 0930   ceFAZolin (ANCEF) IVPB 2 g/50 mL premix        2 g 100 mL/hr over 30 Minutes Intravenous 60 min pre-op 09/20/11 0920 09/21/11 1115          Assessment/Plan: s/p Procedure(s) (LRB): MASTECTOMY WITH SENTINEL LYMPH NODE BIOPSY (Bilateral) TISSUE EXPANDER (Bilateral) AXILLARY LYMPH NODE DISSECTION (Left) PO pain meds, regular diet Possible discharge tomorrow  LOS: 1 day    Samantha Mckay K. 09/22/2011

## 2011-09-22 NOTE — Op Note (Signed)
NAMEMarland Kitchen  Samantha Mckay, Samantha Mckay NO.:  [id number]  MEDICAL RECORD NO.:  [id number]  LOCATION:  5122                         FACILITY:  [facility]  PHYSICIAN:  Wayland Denis, DO      DATE OF BIRTH:  10-30-1965  DATE OF PROCEDURE:  09/22/2011 DATE OF DISCHARGE:                              OPERATIVE REPORT   PREOPERATIVE DIAGNOSIS:  Left breast cancer.  POSTOPERATIVE DIAGNOSIS:  Left breast cancer.  PROCEDURE:  Immediate bilateral reconstruction of breasts with expander placement and AlloDerm.  ATTENDING:  Wayland Denis, DO  ANESTHESIA:  General.  INDICATION FOR PROCEDURE:  The patient is a 46 year old female who was diagnosed with left-sided breast cancer and opted for bilateral mastectomies with immediate reconstruction.  The risks and complications were reviewed in detail included infection, bleeding, pain, scar, risk of anesthesia, capsular contracture, and loss of reconstruction if radiation was needed.  Consent was confirmed.  DESCRIPTION OF PROCEDURE:  The patient was taken to the operating room and placed on the operating room table in supine position by General Surgery.  They performed their portion of the case, which included bilateral mastectomies with sentinel lymph node dissection on the left, followed by lymph node dissection.  Once the general surgery portion of the case was complete, the patient was rendered to the plastic and reconstructive surgery team.  The patient was redraped.  Time-out was called.  All information was confirmed to be correct.  We began on the right side.  The right pocket was irrigated with antibiotic solution. Hemostasis was achieved with electrocautery.  The pectoralis major muscle was lifted off the chest wall to create a pocket for the expander to be placed.  That was done with electrocautery.  Once that was complete, the expander was prepared according to the manufacture's guidelines.  A 350 medium height Mentor expander was  selected.  The air was evacuated and 100 mL of injectable normal saline was placed in the expander.  The AlloDerm was prepared according to the manufacture's guidelines.  A 4 x 16 piece of AlloDerm was utilized by cutting an 8 x 6 sheet.  The AlloDerm was soaked in saline.  Once ready, it was tacked to the edge of the pectoralis with 2-0 PDS.  Figure-of-eight sutures and a running suture was used.  The AlloDerm edge was then tacked to the inframammary fold of the chest wall at the juncture of the rectus muscle.  Several clips were placed in the AlloDerm to fenestrate it to prevent fluid retention under the AlloDerm.  The side was left open of the AlloDerm laterally and the expander was placed under the pectoralis major muscle and the AlloDerm.  The lateral edge of the AlloDerm was then tacked to the lateral chest wall with 2-0 PDS.  A 15 blade was used to make a stab incision and hemostat was used to bring a drain through at the lateral inframammary fold.  The drain was tacked to the skin of the chest using a 3-0 silk.  The deep layers of the incision were closed with 3-0 Vicryl, followed by a 4-0 Vicryl and skin edges were reapproximated with 5-0 Monocryl of running subcuticular stitch. Dermabond was applied.  The same procedure  was done on the other side with the addition of an extra drain placed at the axilla, but coming out at the inframammary folds. Then, a 350cc Mentor medium height expander was utilized to the other side and 100 mL of injectable normal saline was place in the expander. The incision was closed in the same exact fashion on the other side. Dermabond was applied.  4x4s were placed over the drain and ABDs over the incision, and breast binder was placed.  The patient tolerated the procedure well.  There were no complications.  She was awoken and taken to recovery room in stable condition.     Wayland Denis, DO     CS/MEDQ  D:  09/21/2011  T:  09/22/2011  Job:   [id number]

## 2011-09-23 ENCOUNTER — Encounter (HOSPITAL_COMMUNITY): Payer: Self-pay | Admitting: Surgery

## 2011-09-23 ENCOUNTER — Other Ambulatory Visit (INDEPENDENT_AMBULATORY_CARE_PROVIDER_SITE_OTHER): Payer: Self-pay | Admitting: Surgery

## 2011-09-23 MED ORDER — DSS 100 MG PO CAPS: 100.0000 mg | ORAL_CAPSULE | Freq: Three times a day (TID) | ORAL | Status: AC | PRN

## 2011-09-23 NOTE — Progress Notes (Signed)
2 Days Post-Op  Subjective: More comfortable today.  Ready to go home  Objective: Vital signs in last 24 hours: Temp:  [98.2 F (36.8 C)-99.7 F (37.6 C)] 99.6 F (37.6 C) (02/27 0554) Pulse Rate:  [72-102] 100  (02/27 0554) Resp:  [18-20] 18  (02/27 0554) BP: (93-109)/(58-72) 93/60 mmHg (02/27 0554) SpO2:  [94 %-99 %] 94 % (02/27 0554) Last BM Date: 09/20/11  Intake/Output from previous day: 02/26 0701 - 02/27 0700 In: 3029.2 [P.O.:600; I.V.:2429.2] Out: 3300 [Urine:3050; Drains:250] Intake/Output this shift:    General appearance: alert, cooperative and no distress Dressings just changed by Dr. Kelly Splinter  Lab Results:   Samantha Mckay 09/22/11 0647  WBC 8.5  HGB 11.4*  HCT 35.3*  PLT 178   BMET  Basename 09/22/11 0647  NA 135  K 3.9  CL 104  CO2 23  GLUCOSE 135*  BUN 8  CREATININE 0.71  CALCIUM 8.4   PT/INR No results found for this basename: LABPROT:2,INR:2 in the last 72 hours ABG No results found for this basename: PHART:2,PCO2:2,PO2:2,HCO3:2 in the last 72 hours  Studies/Results: Nm Sentinel Node Inj-no Rpt (breast)  09/21/2011  CLINICAL DATA: Left breast DCIS - mastectomy   Sulfur colloid was injected intradermally by the nuclear medicine  technologist for breast cancer sentinel node localization.      Anti-infectives: Anti-infectives     Start     Dose/Rate Route Frequency Ordered Stop   09/21/11 1600   ceFAZolin (ANCEF) IVPB 1 g/50 mL premix        1 g 100 mL/hr over 30 Minutes Intravenous Every 6 hours 09/21/11 1557 09/22/11 0443   09/21/11 1342   polymyxin B 500,000 Units, bacitracin 50,000 Units in sodium chloride irrigation 0.9 % 500 mL irrigation  Status:  Discontinued          As needed 09/21/11 1342 09/21/11 1531   09/21/11 1211   polymyxin B 500,000 Units, bacitracin 50,000 Units in sodium chloride irrigation 0.9 % 500 mL irrigation  Status:  Discontinued          As needed 09/21/11 1212 09/21/11 1531   09/20/11 0930   ceFAZolin (ANCEF)  IVPB 2 g/50 mL premix        2 g 100 mL/hr over 30 Minutes Intravenous 60 min pre-op 09/20/11 0920 09/21/11 1115          Assessment/Plan: s/p Procedure(s) (LRB): MASTECTOMY WITH SENTINEL LYMPH NODE BIOPSY (Bilateral) TISSUE EXPANDER (Bilateral) AXILLARY LYMPH NODE DISSECTION (Left) Discharge  LOS: 2 days    Jakaya Jacobowitz K. 09/23/2011

## 2011-09-23 NOTE — Progress Notes (Signed)
2 Days Post-Op  Subjective: The patient is awake and feeling better this morning.  She is ready for discharge.  Objective: Vital signs in last 24 hours: Temp:  [98.2 F (36.8 C)-99.7 F (37.6 C)] 99.6 F (37.6 C) (02/27 0554) Pulse Rate:  [72-102] 100  (02/27 0554) Resp:  [18-20] 18  (02/27 0554) BP: (93-109)/(58-72) 93/60 mmHg (02/27 0554) SpO2:  [94 %-99 %] 94 % (02/27 0554) Last BM Date: 09/20/11  Intake/Output from previous day: 02/26 0701 - 02/27 0700 In: 3029.2 [P.O.:600; I.V.:2429.2] Out: 3300 [Urine:3050; Drains:250] Intake/Output this shift:    General appearance: alert, cooperative and no distress Incision/Wound:dry and no sign of fluid collection. Drains working.  Lab Results:   [facility] 09/22/11 0647  WBC 8.5  HGB 11.4*  HCT 35.3*  PLT 178   BMET  Basename 09/22/11 0647  NA 135  K 3.9  CL 104  CO2 23  GLUCOSE 135*  BUN 8  CREATININE 0.71  CALCIUM 8.4   PT/INR No results found for this basename: LABPROT:2,INR:2 in the last 72 hours ABG No results found for this basename: PHART:2,PCO2:2,PO2:2,HCO3:2 in the last 72 hours  Studies/Results: Nm Sentinel Node Inj-no Rpt (breast)  09/21/2011  CLINICAL DATA: Left breast DCIS - mastectomy   Sulfur colloid was injected intradermally by the nuclear medicine  technologist for breast cancer sentinel node localization.      Anti-infectives: Anti-infectives     Start     Dose/Rate Route Frequency Ordered Stop   09/21/11 1600   ceFAZolin (ANCEF) IVPB 1 g/50 mL premix        1 g 100 mL/hr over 30 Minutes Intravenous Every 6 hours 09/21/11 1557 09/22/11 0443   09/21/11 1342   polymyxin B 500,000 Units, bacitracin 50,000 Units in sodium chloride irrigation 0.9 % 500 mL irrigation  Status:  Discontinued          As needed 09/21/11 1342 09/21/11 1531   09/21/11 1211   polymyxin B 500,000 Units, bacitracin 50,000 Units in sodium chloride irrigation 0.9 % 500 mL irrigation  Status:  Discontinued          As  needed 09/21/11 1212 09/21/11 1531   09/20/11 0930   ceFAZolin (ANCEF) IVPB 2 g/50 mL premix        2 g 100 mL/hr over 30 Minutes Intravenous 60 min pre-op 09/20/11 0920 09/21/11 1115          Assessment/Plan: s/p Procedure(s) (LRB): MASTECTOMY WITH SENTINEL LYMPH NODE BIOPSY (Bilateral) TISSUE EXPANDER (Bilateral) AXILLARY LYMPH NODE DISSECTION (Left) Discharge to home  LOS: 2 days    [facility] 09/23/2011

## 2011-09-23 NOTE — Discharge Instructions (Signed)
Sink bath Drain care daily with recording of amounts Scripts already given and at home

## 2011-09-23 NOTE — Discharge Summary (Signed)
Physician Discharge Summary  Patient ID: SHERHONDA GASPAR MRN: [id number] DOB/AGE: Mar 05, 1966 46 y.o.  Admit date: 09/21/2011 Discharge date: 09/23/2011  Admission Diagnoses: left breast cancer  Discharge Diagnoses: left breast cancer Active Problems:  * No active hospital problems. *    Discharged Condition: good  Hospital Course: The patient was taken to the OR for bilateral mastectomies and immediate reconstruction with expander and alloderm placement.  She was managed post operatively on the surgery unit.  Her pain was controlled with IV pain medicine and then transitioned to oral medication.  She was tolerating food well, pain was controlled and she was voiding without difficulty.   Consults: None  Significant Diagnostic Studies: labs  Treatments: surgery: bilateral mastectomies with left axillary dissection and immediate reconstruction  Discharge Exam: Blood pressure 93/60, pulse 100, temperature 99.6 F (37.6 C), temperature source Oral, resp. rate 18, height 5\' 1"  (1.549 m), weight 69.899 kg (154 lb 1.6 oz), SpO2 94.00%. General appearance: alert, cooperative and no distress Incision/Wound:intact and no sign of fluid collection  Disposition: Final discharge disposition not confirmed   Medication List  As of 09/23/2011  7:19 AM   TAKE these medications         cholecalciferol 1000 UNITS tablet   Commonly known as: VITAMIN D   Take 1,000 Units by mouth 2 (two) times daily.      DSS 100 MG Caps   Take 100 mg by mouth 3 (three) times daily as needed for constipation.      ibuprofen 200 MG tablet   Commonly known as: ADVIL,MOTRIN   Take 200 mg by mouth every 6 (six) hours as needed.      multivitamin tablet   Take 1 tablet by mouth daily.           Follow-up Information    Follow up with [facility], DO in 1 week.   Contact information:   [address] [place] [address] [phone]           Signed: Wayland Denis 09/23/2011, 7:19 AM

## 2011-09-23 NOTE — Progress Notes (Signed)
Dischrage home. Home discharge instruction given, JP teaching given to patient and spouse. JP sheet given. Patient is alert and oriented, not in any distress.

## 2011-09-25 ENCOUNTER — Telehealth: Payer: Self-pay | Admitting: *Deleted

## 2011-09-25 ENCOUNTER — Other Ambulatory Visit (INDEPENDENT_AMBULATORY_CARE_PROVIDER_SITE_OTHER): Payer: Self-pay | Admitting: Surgery

## 2011-09-25 DIAGNOSIS — C50912 Malignant neoplasm of unspecified site of left female breast: Secondary | ICD-10-CM

## 2011-09-25 NOTE — Telephone Encounter (Signed)
Confirmed 10/05/11 appt w/ pt.  Mailed before appt letter & packet to pt.

## 2011-09-30 ENCOUNTER — Encounter: Payer: Self-pay | Admitting: Radiation Oncology

## 2011-09-30 ENCOUNTER — Encounter: Payer: Self-pay | Admitting: *Deleted

## 2011-09-30 ENCOUNTER — Ambulatory Visit
Admission: RE | Admit: 2011-09-30 | Discharge: 2011-09-30 | Disposition: A | Discharge status: Home / Self Care | Payer: Managed Care, Other (non HMO) | Source: Ambulatory Visit | Attending: Radiation Oncology | Admitting: Radiation Oncology

## 2011-09-30 VITALS — BP 108/68 | HR 112 | Temp 98.6°F | Wt 158.8 lb

## 2011-09-30 DIAGNOSIS — C50919 Malignant neoplasm of unspecified site of unspecified female breast: Secondary | ICD-10-CM | POA: Insufficient documentation

## 2011-09-30 DIAGNOSIS — D0510 Intraductal carcinoma in situ of unspecified breast: Secondary | ICD-10-CM

## 2011-09-30 DIAGNOSIS — C773 Secondary and unspecified malignant neoplasm of axilla and upper limb lymph nodes: Secondary | ICD-10-CM | POA: Insufficient documentation

## 2011-09-30 DIAGNOSIS — Z9010 Acquired absence of unspecified breast and nipple: Secondary | ICD-10-CM | POA: Insufficient documentation

## 2011-09-30 DIAGNOSIS — Z170 Estrogen receptor positive status [ER+]: Secondary | ICD-10-CM | POA: Insufficient documentation

## 2011-09-30 NOTE — Progress Notes (Signed)
CC:   Samantha Mckay, M.D. Samantha Denis, DO Samantha Mckay, M.D., F.R.C.P.C. Samantha Mckay  REFERRING PHYSICIAN:  Wilmon Mckay. Mckay, M.D.  DIAGNOSIS:  Stage II invasive ductal carcinoma of the left breast (mpT10mic pN1a MX).  HPI:  Samantha Mckay is a very pleasant 46 year old female who is seen out of the courtesy of Dr. Corliss Mckay for an opinion concerning radiation therapy as part of management of patient's recently diagnosed left breast cancer.  Recently, Samantha Mckay underwent her 1st screening mammogram.  This was performed at the [facility] in [place].  The patient was called back for magnification views on the left side after suspicious calcifications were noted in the inferior lower aspect of the left breast.  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  MRI was subsequently performed on January 9th.  This showed patchy segmental area of enhancement measuring up 5.1 cm within the lower inner quadrant of the left breast.  There are no suspicious findings within the right breast.  Given the extent of involvement, it was felt that breast-conserving therapy would be difficult.  At a later date, Samantha Mckay decided to proceed with bilateral mastectomies and immediate reconstruction.  Patient's surgery was performed on September 22, 2011.  The patient's breast cancer surgery was performed by Dr. Corliss Mckay and the reconstruction was performed by Dr. Wayland Mckay.  On the right side, the patient had a simple mastectomy.  There was no malignancy recovered in the right chest area.  On the left side, the patient underwent a mastectomy and sentinel node procedure.  The patient was found have 1 positive sentinel lymph node and she subsequently proceeded to undergo axillary dissection.  The patient had 3 sentinel lymph nodes removed from the left axilla, one of which was positive  as above.  The patient's axillary dissection showed 14 benign lymph nodes. Within the left breast, the patient was found have an invasive ductal carcinoma within 2 of microscopic foci, both measuring less than 0.1 cm. 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  The deep margin was very close at less than 0.1 cm with high-grade ductal carcinoma in situ.  The surgical margins on the invasive tumor were at least 1 cm.  Postoperatively, the patient has been doing well.  She continues to see Samantha Mckay frequently in light of the patient's Al Pimple drains.  Patient is now seen in Radiation Oncology for evaluation as it relates to her stage II left breast cancer.  PAST MEDICAL HISTORY:  The patient has no known drug allergies.  CURRENT MEDICATIONS:  Colace 100 mg 3 times daily as needed for constipation.  The patient is also on Advil as needed for pain.  The patient takes vitamin D supplement and a multivitamin supplement.  The patient denies any chronic medical problems.  The patient does have a history of an intrauterine device insertion.  SOCIAL HISTORY:  The patient lives in the [place] area.  She has 3 children.  The patient was recently laid off from her work in late 2012. There is no history of tobacco use or alcohol intake.  FAMILY HISTORY:  No family history of breast or ovarian cancer.  There is a family history of gastric cancer in the patient's mother.  REVIEW OF SYSTEMS:  The patient has some discomfort along the chest area but seems to  be managing fairly well.  She does have some mild chronic headaches.  The patient denies any double vision or blurred vision.  The patient occasionally will have some mild low back pain.  A complete review of systems is undertaken with the patient and other than the above-mentioned issues is unremarkable.  PHYSICAL EXAMINATION:  General:  This is a  very pleasant young female in no acute distress.  Vital signs:  Temperature 98.6, pulse 112, blood pressure 108/68.  Her weight is 158 pounds.  HEENT:  Examination of the pupils reveals them to be equal and reactive to light.  The extraocular eye movements are intact.  The tongue is midline.  There is no secondary infection noted in the oral cavity or posterior pharynx.  Examination of the neck and supraclavicular region reveals no evidence of adenopathy. Lungs:  Clear to auscultation.  Heart:  Regular rhythm and rate. Examination of the axillary areas reveals no evidence of adenopathy. Examination of the chest area reveals mastectomy scar with tissue expanders in place.  There are no signs of infection along the left or right chest area.  The patient has Jackson-Pratt drains in place along both areas.  Abdomen:  Soft and nontender with normal bowel sounds. There is no obvious hepatosplenomegaly.  Neurological.  Motor strength is 5/5 in the proximal and distal muscle groups of the upper and lower extremities.  Peripheral pulses are good.  There is no appreciable cyanosis, clubbing or edema in the extremities.  LABORATORY DATA:  From October 27th, white count 8.5, hemoglobin 11.4, hematocrit 35.3, platelet count 178,000.  Sodium 135, potassium 3.9, chloride 104, CO2 23, glucose 135, BUN 8, creatinine 0.71.  X-RAY STUDIES:  The patient did undergo MRI of the abdominal area which showed numerous simple cysts within the hepatic parenchyma which was not felt to be consistent with malignancy.  IMPRESSION AND PLAN:  Stage II invasive ductal carcinoma of the left breast.  As above, the patient was found have 2 microscopic foci of invasive disease within the left breast as well as metastatic spread to 1 sentinel lymph node.  Of concern is the patient did have a very close (less than 1 mm) deep margin with intraductal breast cancer.  I discussed in detail the pathologic findings with [name].  We discussed general indications for postmastectomy irradiation, particularly as it relates to reconstructed chest.  I should mention that the patient did have a tissue expander placed underneath the left and right pectoralis major muscle.  Given the controversy concerning postmastectomy irradiation in this situation, I would like to have the patient's case presented at the Multidisciplinary Breast Conference. The patient in addition will be seeing Dr. Donnie Coffin next week for Medical Oncology evaluation.  Final details concerning the patient's management will depend on conference issues as well as Medical Oncology input.    ______________________________ Billie Lade, Ph.D., M.D. JDK/MEDQ  D:  09/30/2011  T:  09/30/2011  Job:  [id number]

## 2011-09-30 NOTE — Progress Notes (Signed)
Here for radiation consultation for diagnosis of left breast consultation.  Patient still has jp drains.Plan to follow up with surgeon in approximately  2 to 3 weeks.

## 2011-09-30 NOTE — Progress Notes (Signed)
Encounter addended by: Tessa Lerner, RN on: 09/30/2011  2:25 PM<BR>     Documentation filed: Charges VN

## 2011-09-30 NOTE — Progress Notes (Signed)
Please see the Nurse Progress Note in the MD Initial Consult Encounter for this patient. 

## 2011-10-01 ENCOUNTER — Other Ambulatory Visit: Payer: Self-pay | Admitting: *Deleted

## 2011-10-01 DIAGNOSIS — C50319 Malignant neoplasm of lower-inner quadrant of unspecified female breast: Secondary | ICD-10-CM

## 2011-10-05 ENCOUNTER — Ambulatory Visit (HOSPITAL_BASED_OUTPATIENT_CLINIC_OR_DEPARTMENT_OTHER): Payer: Managed Care, Other (non HMO) | Admitting: Oncology

## 2011-10-05 ENCOUNTER — Other Ambulatory Visit (HOSPITAL_BASED_OUTPATIENT_CLINIC_OR_DEPARTMENT_OTHER): Payer: Managed Care, Other (non HMO) | Admitting: Lab

## 2011-10-05 ENCOUNTER — Ambulatory Visit: Payer: Managed Care, Other (non HMO)

## 2011-10-05 VITALS — BP 100/68 | HR 83 | Temp 98.6°F | Ht 61.0 in | Wt 158.4 lb

## 2011-10-05 DIAGNOSIS — C50319 Malignant neoplasm of lower-inner quadrant of unspecified female breast: Secondary | ICD-10-CM

## 2011-10-05 DIAGNOSIS — D0590 Unspecified type of carcinoma in situ of unspecified breast: Secondary | ICD-10-CM

## 2011-10-05 DIAGNOSIS — C50919 Malignant neoplasm of unspecified site of unspecified female breast: Secondary | ICD-10-CM

## 2011-10-05 LAB — CANCER ANTIGEN 27.29: CA 27.29: 13 U/mL (ref 0–39)

## 2011-10-05 LAB — COMPREHENSIVE METABOLIC PANEL
ALT: 24 U/L (ref 0–35)
AST: 30 U/L (ref 0–37)
Albumin: 3.3 g/dL — ABNORMAL LOW (ref 3.5–5.2)
Calcium: 8.8 mg/dL (ref 8.4–10.5)
Chloride: 108 mEq/L (ref 96–112)
Potassium: 4.7 mEq/L (ref 3.5–5.3)

## 2011-10-05 LAB — CBC WITH DIFFERENTIAL/PLATELET
BASO%: 0.2 % (ref 0.0–2.0)
HCT: 35.9 % (ref 34.8–46.6)
MCHC: 32.3 g/dL (ref 31.5–36.0)
MONO#: 0.4 10*3/uL (ref 0.1–0.9)
RBC: 4.28 10*6/uL (ref 3.70–5.45)
WBC: 5.3 10*3/uL (ref 3.9–10.3)
lymph#: 2.1 10*3/uL (ref 0.9–3.3)
nRBC: 0 % (ref 0–0)

## 2011-10-06 NOTE — Progress Notes (Signed)
Referral MD Dr Gwyndolyn Kaufman  Reason for Referral: Breast cancer    No chief complaint on file. : This is a pleasant 46 year old woman who presented for her first mammogram. Coincidentally she noted a mass in her left breast prior to that visit. This demonstrated calcifications in her left breast and a biopsy was recommended. 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 MRI scan in 08/05/2011 showed patchy segmental enhancement measuring 5.1 x 2.3 x 2.1 cm. Incidental note was made of a 10.7 cm mass liver which on subsequent imaging suggested hemangioma. Given the extensive involvement of supposedly DCIS, the patient underwent bilateral mastectomies with sentinel lymph node evaluation on the right side benign breast parenchyma was seen 2 lymph nodes were identified both of which were free of cancer. Progress either 2 microscopic foci of invasive ductal cancer each measuring less than 0.1 cm. There was extensive high-grade DCIS with comedo necrosis seen. One sentinel lymph nodes identified as being positive for metastatic ductal cancer 2 additional lymph nodes were negative for malignancy. An additional 14 lymph nodes were identified all of which were negative for malignancy. The patient was also Dr. Kelly Splinter who placed expanders bilaterally. Postoperatively the patient is on well. She still has drains in place. HPI: Past Medical History  Diagnosis Date  . Liver mass      numerous simple cysts throughout hepatic parenbhyma,one 10.7cm  . Breast cancer 09/22/11      right and left masectomy wirth expanders  . Breast cancer 09/21/11 [facility]    Left breast  masectomy-invasive ductal ca  . Breast cancer     ER/PR positive  . Cancer     breast  :  Past Surgical History  Procedure Date  . Mastectomy w/ sentinel node biopsy 09/21/2011    Procedure: MASTECTOMY WITH SENTINEL LYMPH NODE BIOPSY;  Surgeon: Wilmon Arms. Corliss Skains, MD;  Location: [facility] OR;  Service: General;   Laterality: Bilateral;  Bilateral total mastectomies with left axillary sentinel lymph node biopsy.  Marland Kitchen Axillary lymph node dissection 09/21/2011    Procedure: AXILLARY LYMPH NODE DISSECTION;  Surgeon: Wilmon Arms. Corliss Skains, MD;  Location: [facility] OR;  Service: General;  Laterality: Left;  . Tissue expander placement 09/21/2011    Procedure: TISSUE EXPANDER;  Surgeon: Wayland Denis, DO;  Location: [facility] OR;  Service: Plastics;  Laterality: Bilateral;  Bilateral breast reconstruction with placement of bilateral tissue expanders.  . Intrauterine device insertion   :  Current outpatient prescriptions:cholecalciferol (VITAMIN D) 1000 UNITS tablet, Take 1,000 Units by mouth 2 (two) times daily., Disp: , Rfl: ;  ibuprofen (ADVIL,MOTRIN) 200 MG tablet, Take 200 mg by mouth every 6 (six) hours as needed., Disp: , Rfl: ;  Multiple Vitamin (MULTIVITAMIN) tablet, Take 1 tablet by mouth daily., Disp: , Rfl: :    :  No Known Allergies:  Family History  Problem Relation Age of Onset  . Cancer Mother     stomach  . Anesthesia problems Neg Hx   . Hypotension Neg Hx   . Malignant hyperthermia Neg Hx   . Pseudochol deficiency Neg Hx   :  History   Social History  . Marital Status: Married x25 y    Spouse Name: Marquita Palms;     Number of Children: 4   . Years of Education: N/A   Occupational History  . Not on file. Recently layed off from [employer]   Social History Main Topics  . Smoking status: Never Smoker   .  Smokeless tobacco: Never Used   Comment: occ glass of wine  . Alcohol Use: Yes  . Drug Use: No  . Sexually Active: Yes    Birth Control/ Protection: IUD    mirena iud  menarche age 30,1st preg.,age 29,hormones-yes   Other Topics Concern  . Not on file   Social History Narrative  . No narrative on file; father- A+W; mother- gastric cancer. 1 sister a+w; 5, 1/2 brothers.  :  Repro Hx Menarche 13, regular menses; has mirena iud  ROS  the patient has been in good health. She has not suffered any  weight changes or changes in appetite. She denies any night sweats or fevers. She denies shortness of breath, cough, shortness of chest she denies abdominal pain nausea vomiting numbness tingling in hands and feet she has been having some headaches recently as well as the mid to low back pain.   Exam: Pleasant older woman looking stated age Height 5 one , weight 158 pounds , blood pressure 100/68 ; pulse 83  General appearance: alert, cooperative and appears stated age Eyes: conjunctivae/corneas clear. PERRL, EOM's intact. Fundi benign. Throat: lips, mucosa, and tongue normal; teeth and gums normal Resp: clear to auscultation bilaterally and normal percussion bilaterally Chest wall: no tenderness, right sided chest wall tenderness Breasts: normal appearance, no masses or tenderness, Status post bilateral mastectomies with expander is in place. Bilateral JP drains are in place. No tenderness no obvious drainage. Both axilla negative. Cardio: regular rate and rhythm, S1, S2 normal, no murmur, click, rub or gallop and normal apical impulse GI: soft, non-tender; bowel sounds normal; no masses,  no organomegaly Extremities: extremities normal, atraumatic, no cyanosis or edema Pulses: 2+ and symmetric Lymph nodes: Cervical, supraclavicular, and axillary nodes normal. Neurologic: Alert and oriented X 3, normal strength and tone. Normal symmetric reflexes. Normal coordination and gait   Basename 10/05/11 0959  WBC 5.3  HGB 11.6  HCT 35.9  PLT 222    Basename 10/05/11 0959  NA 141  K 4.7  CL 108  CO2 25  GLUCOSE 89  BUN 12  CREATININE 0.78  CALCIUM 8.8    Blood smear review: n/a  Pathology:as above   Assessment and Plan: Pleasant premenopausal woman who presents with extensive DCIS involving the breast and evidence of invasive cancer seen both as microscopic disease in the breast as well as invasive cancer in lymph node. Her disease is ER/PR positive and HER-2 negative. One lymph  node was involved out of 19. We discussed the implications of this. Certainly from an oncological point of view may be appropriate to give her adjuvant chemotherapy. He has been seen by radiation oncology who were considering giving her radiation therapy to the chest wall. On [place] and removal lymph node is 1.97 which is in the clinical zone. I have discussed this with her. We will attempt to confirm HER-2 status on this tissue by sending it to another company. If this is not validated may still consider her for adjuvant chemotherapy given her age. She did point out to as do genetic testing was performed and apparently she is positive for the BRCA 2 mutation. As such will need to consider her for some form of oophorectomy at some point. We will go ahead with some staging scans and I will see her back in followup after we have obtained information from the referring company regarding HER-2 status.  Mervin Hack M.D. FRCPC    60 minutes was spent with the patient half the  time and patient-related counseling

## 2011-10-09 ENCOUNTER — Telehealth: Payer: Self-pay | Admitting: *Deleted

## 2011-10-09 NOTE — Telephone Encounter (Signed)
gave patient appointment for 03-2012 printed out calendar and gave to the patient 

## 2011-10-14 ENCOUNTER — Other Ambulatory Visit: Payer: Managed Care, Other (non HMO)

## 2011-10-14 ENCOUNTER — Other Ambulatory Visit: Payer: Self-pay | Admitting: Oncology

## 2011-10-15 ENCOUNTER — Telehealth: Payer: Self-pay | Admitting: *Deleted

## 2011-10-15 NOTE — Telephone Encounter (Signed)
patient confirmed over the phone the new date and time of the appointment in 10-22-2011 at 11:30am

## 2011-10-19 ENCOUNTER — Encounter: Payer: Self-pay | Admitting: *Deleted

## 2011-10-19 NOTE — Progress Notes (Signed)
Mailed after appt letter to pt. 

## 2011-10-22 ENCOUNTER — Encounter: Payer: Self-pay | Admitting: Oncology

## 2011-10-22 ENCOUNTER — Telehealth: Payer: Self-pay | Admitting: Oncology

## 2011-10-22 ENCOUNTER — Ambulatory Visit (HOSPITAL_BASED_OUTPATIENT_CLINIC_OR_DEPARTMENT_OTHER): Payer: Managed Care, Other (non HMO) | Admitting: Oncology

## 2011-10-22 VITALS — BP 109/71 | HR 85 | Temp 97.9°F | Ht 61.0 in | Wt 157.6 lb

## 2011-10-22 DIAGNOSIS — C50919 Malignant neoplasm of unspecified site of unspecified female breast: Secondary | ICD-10-CM

## 2011-10-22 NOTE — Telephone Encounter (Signed)
gve a fax sheet to medical records for them to submit the documents to dr [facility] office for the appt purposes.

## 2011-10-22 NOTE — Progress Notes (Signed)
Hematology and Oncology Follow Up Visit  Samantha Mckay [id number] May 27, 1966 46 y.o. 10/22/2011 8:31 PM PCP  Principle Diagnosis:  46 year old woman with history of large volume DCIS with associated microinvasive breast cancer status post mastectomy with one involved sentinel lymph node, strongly ER and PR positive HER-2 equivocal with a ratio of 1.97. Status post bilateral mastectomies with implants History of BRCA positivity  Interim History:  There have been no intercurrent illness, hospitalizations or medication changes. Drains have been removed  Medications: I have reviewed the patient's current medications.  Allergies: No Known Allergies  Past Medical History, Surgical history, Social history, and Family History were reviewed and updated.  Review of Systems: Constitutional:  Negative for fever, chills, night sweats, anorexia, weight loss, pain. Cardiovascular: negative Respiratory: negative Neurological: negative Dermatological: negative ENT: negative Skin Gastrointestinal: negative Genito-Urinary: negative Hematological and Lymphatic: negative Breast: negative Musculoskeletal: negative Remaining ROS negative.  Physical Exam: Blood pressure 109/71, pulse 85, temperature 97.9 F (36.6 C), height 5\' 1"  (1.549 m), weight 157 lb 9.6 oz (71.487 kg). ECOG: 0 General appearance: alert, cooperative and appears stated age Head: Normocephalic, without obvious abnormality, atraumatic Neck: no adenopathy, no carotid bruit, no JVD, supple, symmetrical, trachea midline and thyroid not enlarged, symmetric, no tenderness/mass/nodules Lymph nodes: Cervical, supraclavicular, and axillary nodes normal. Cardiac : regular rate and rhythm, no murmurs or gallops Pulmonary:clear to auscultation bilaterally Breasts: negative Abdomen:soft, non-tender; bowel sounds normal; no masses,  no organomegaly Extremities negative Neuro: alert, oriented, normal speech, no focal findings or  movement disorder noted  Lab Results: Lab Results  Component Value Date   WBC 5.3 10/05/2011   HGB 11.6 10/05/2011   HCT 35.9 10/05/2011   MCV 83.9 10/05/2011   PLT 222 10/05/2011     Chemistry      Component Value Date/Time   NA 141 10/05/2011 0959   K 4.7 10/05/2011 0959   CL 108 10/05/2011 0959   CO2 25 10/05/2011 0959   BUN 12 10/05/2011 0959   CREATININE 0.78 10/05/2011 0959      Component Value Date/Time   CALCIUM 8.8 10/05/2011 0959   ALKPHOS 66 10/05/2011 0959   AST 30 10/05/2011 0959   ALT 24 10/05/2011 0959   BILITOT 0.2* 10/05/2011 0959      .pathology. Radiological Studies: chest X-ray n/a Mammogram n/a Bone density n/a  Impression and Plan: We had oophorectomy fairly extensive discussion today regarding treatment plans. Unfortunately we are unable to confirm the HER-2 positivity on her one lymph node due to insufficient material. As such she has microinvasive disease that was ER/PR positive on the noninvasive component as well one involved lymph node. Given her age I suspect that we should move ahead with some form of chemotherapy. On alternative I did outline to her was the possibility of oophorectomy with the use of a anti-estrogen . There is sufficient data from the [place] literature that does compare this with adjuvant chemotherapy and finds a very similar outcome. As she does require oophorectomy , this might be a good option for her. I will be in touch with her gynecologist, and see her back in approximately one month  More than 50% of the visit was spent in patient-related counselling   Pierce Crane, MD 3/28/20138:31 PM

## 2011-10-22 NOTE — Progress Notes (Signed)
Patient approve for [employer]  10/22/11 - 04/23/12.

## 2011-10-22 NOTE — Telephone Encounter (Signed)
gve the pt her may 2013 appt calendar along with the appt to see dr Alyce Pagan in [place] in april

## 2011-11-03 ENCOUNTER — Ambulatory Visit (INDEPENDENT_AMBULATORY_CARE_PROVIDER_SITE_OTHER): Payer: Managed Care, Other (non HMO) | Admitting: Surgery

## 2011-11-03 ENCOUNTER — Encounter (INDEPENDENT_AMBULATORY_CARE_PROVIDER_SITE_OTHER): Payer: Self-pay | Admitting: Surgery

## 2011-11-03 VITALS — BP 122/80 | HR 70 | Temp 97.8°F | Resp 16 | Ht 61.0 in | Wt 175.0 lb

## 2011-11-03 DIAGNOSIS — C50919 Malignant neoplasm of unspecified site of unspecified female breast: Secondary | ICD-10-CM

## 2011-11-03 NOTE — Progress Notes (Signed)
S/p bilateral mastectomies and left axillary sentinel lymph node biopsy on 09/21/11.  The patient underwent placement of tissue expanders by Dr. Kelly Splinter.  Her drains have all been removed and the expanders are beginning to be filled.  Overall she is doing well.  She has some numbness on the medial upper left arm, and she does not feel as strong in her left arm as her right.    Her pathology showed large volume DCIS in the left with two foci of microinvasive ductal carcinoma with one involved sentinel lymph node.  ER and PR positive.  BRCA positive.  Her incisions are healing well over the expanders.  No sign of infection or seroma.  No axillary lymphadenopathy or seroma.  She seems to have equal strength in both upper extremities.  S/p bilateral mastectomies with immediate reconstruction.  Follow-up in 6 months. Plan for adjuvant therapy with Dr. Donnie Coffin and Dr. Roselind Messier - may undergo bilateral oophorectomy.  Wilmon Arms. Corliss Skains, MD, [facility]  11/03/2011 4:32 PM

## 2011-11-06 NOTE — Progress Notes (Signed)
DIAGNOSIS:  Left breast cancer.  I discussed Ms. Dilauro's pathologic findings with my colleagues, particularly as it relates to the patient's immediate reconstruction. The patient was noted to have a close, but not a transected surgical margin.  Given the fact that the patient did have immediate reconstruction and lack of strong benefit for post-mastectomy irradiation and close intraductal margins, I would not recommend radiation therapy as part of the patient's overall management.    ______________________________ Billie Lade, Ph.D., M.D. JDK/MEDQ  D:  11/05/2011  T:  11/06/2011  Job:  [id number]

## 2011-11-20 ENCOUNTER — Telehealth: Payer: Self-pay | Admitting: Oncology

## 2011-11-20 NOTE — Telephone Encounter (Signed)
lmonvm adviisng the pt that her may 1st appt had to be cancelled and r/s for June due to the md will be out of the office

## 2011-11-25 ENCOUNTER — Telehealth: Payer: Self-pay | Admitting: Oncology

## 2011-11-25 ENCOUNTER — Ambulatory Visit: Payer: Managed Care, Other (non HMO) | Admitting: Oncology

## 2011-11-26 ENCOUNTER — Encounter: Payer: Self-pay | Admitting: Oncology

## 2011-11-26 NOTE — Progress Notes (Signed)
Patient stop by my office on yesterday afternoon needing help to fill out some paper work that she had received from [employer] and cancer care,so I did help her fill out some parts that she needed help with.I did give them to abigail to fax to the foundation.

## 2011-12-01 ENCOUNTER — Encounter: Payer: Self-pay | Admitting: *Deleted

## 2011-12-01 ENCOUNTER — Telehealth: Payer: Self-pay | Admitting: Oncology

## 2011-12-01 NOTE — Telephone Encounter (Signed)
S/w the pt and she is aware of her canceeled 6/3/ appt and r/s with the pa on 01/12/2012

## 2011-12-01 NOTE — Progress Notes (Signed)
Clinical Child psychotherapist received referral from [employer] for additional assistance with financial resources.  CSW met with pt in [facility] office assess for needs and explore financial assistance opportunities.  Pt and CSW reviewed and completed Cancer Care and [employer] financial assistance applications.  CSW submitted the Cancer Care application, and will submit the [employer] application once pt brings in requested information.  CSW encouraged pt to call with any other questions or concerns.    Tamala Julian, MSW, LCSW Clinical Social Worker [facility] [phone]

## 2011-12-16 ENCOUNTER — Encounter: Payer: Self-pay | Admitting: Oncology

## 2011-12-16 NOTE — Progress Notes (Signed)
Patient call today to ask about the [facility] and the [employer] FUND,I told her its just a one time offer fund so once the money is gone we do not start over.

## 2011-12-28 ENCOUNTER — Ambulatory Visit: Payer: Managed Care, Other (non HMO) | Admitting: Oncology

## 2012-01-12 ENCOUNTER — Encounter: Payer: Self-pay | Admitting: Physician Assistant

## 2012-01-12 ENCOUNTER — Telehealth: Payer: Self-pay | Admitting: *Deleted

## 2012-01-12 ENCOUNTER — Ambulatory Visit (HOSPITAL_BASED_OUTPATIENT_CLINIC_OR_DEPARTMENT_OTHER): Payer: Managed Care, Other (non HMO) | Admitting: Physician Assistant

## 2012-01-12 VITALS — BP 102/67 | HR 70 | Temp 98.0°F | Ht 61.0 in | Wt 160.7 lb

## 2012-01-12 DIAGNOSIS — C50119 Malignant neoplasm of central portion of unspecified female breast: Secondary | ICD-10-CM

## 2012-01-12 DIAGNOSIS — D0510 Intraductal carcinoma in situ of unspecified breast: Secondary | ICD-10-CM

## 2012-01-12 DIAGNOSIS — C773 Secondary and unspecified malignant neoplasm of axilla and upper limb lymph nodes: Secondary | ICD-10-CM

## 2012-01-12 NOTE — Telephone Encounter (Signed)
Made patient appointment for 03-18-2012 starting at 11:00am called [facility] in [place] to set up patients bone density appointment printed out calendar and gave to the patient the number i called to setup the bone density is [phone]

## 2012-01-12 NOTE — Progress Notes (Signed)
Hematology and Oncology Follow Up Visit  Samantha Mckay [id number] 1965-10-05 46 y.o. 01/12/2012    HPI: Samantha Mckay is a 46 year-old 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  In the right simple mastectomy with associated 2 lymph node showed no evidence of malignancy. She has undergone bilateral breast reconstruction under the care of Dr. Shella Spearing, upcoming bilateral  salpingo-oophrectomy on 02/15/2012 due to BRCA2 positivity.  Interim History:   Samantha Mckay is seen today for followup. As noted above, she'll be undergoing bilateral salpingo-oophorectomy on 02/15/2012 due to BRCA2 positivity under the care of Dr. Esperanza Richters. She also has had her final expansion performed on her bilateral breast reconstruction sites under the care of Dr. Kelly Splinter. Her next followup with Dr. Kelly Splinter is scheduled for 02/09/2012. She is feeling well, denying any unexplained fevers, chills, or night sweats. No shortness of breath, or chest pain. No nausea, emesis, diarrhea, or constipation issues. She has not had any menstrual periods, of note she has a Mirena IUD in place. A detailed review of systems is otherwise noncontributory as noted below.  Review of Systems: Constitutional:  no weight loss, fever, night sweats and feels well Eyes: no complaints ENT: no complaints Cardiovascular: no chest pain or dyspnea on exertion Respiratory: no cough, shortness of breath, or wheezing Neurological: no TIA or stroke symptoms Dermatological: negative Gastrointestinal: no abdominal pain, change in bowel habits, or black or bloody stools Genito-Urinary: no dysuria, trouble voiding, or hematuria Hematological and Lymphatic: negative Breast: s/p B mastectomies  w/reconstruction Musculoskeletal: negative Remaining ROS negative.  Medications:   I have reviewed the patient's current medications.  Current Outpatient Prescriptions  Medication Sig Dispense Refill  . cholecalciferol (VITAMIN D) 1000 UNITS tablet Take 1,000 Units by mouth 2 (two) times daily.      Marland Kitchen ibuprofen (ADVIL,MOTRIN) 200 MG tablet Take 200 mg by mouth every 6 (six) hours as needed.      . Multiple Vitamin (MULTIVITAMIN) tablet Take 1 tablet by mouth daily.        Allergies: No Known Allergies  Physical Exam: Filed Vitals:   01/12/12 1403  BP: 102/67  Pulse: 70  Temp: 98 F (36.7 C)    Body mass index is 30.36 kg/(m^2). Weight: 160lbs.  Full exam deferred.   Lab Results: Lab Results  Component Value Date   WBC 5.3 10/05/2011   HGB 11.6 10/05/2011   HCT 35.9 10/05/2011   MCV 83.9 10/05/2011   PLT 222 10/05/2011   NEUTROABS 2.6 10/05/2011     Chemistry      Component Value Date/Time   NA 141 10/05/2011 0959   K 4.7 10/05/2011 0959   CL 108 10/05/2011 0959   CO2 25 10/05/2011 0959   BUN 12 10/05/2011 0959   CREATININE 0.78 10/05/2011 0959      Component Value Date/Time   CALCIUM 8.8 10/05/2011 0959   ALKPHOS 66 10/05/2011 0959   AST 30 10/05/2011 0959   ALT 24 10/05/2011 0959   BILITOT 0.2* 10/05/2011 0959      Lab Results  Component Value Date   LABCA2 13 10/05/2011     Assessment:  Samantha Mckay is a 46 year-old 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  In the right simple mastectomy with associated 2 lymph node showed no evidence of malignancy. She has undergone bilateral breast reconstruction under the care of Dr. Shella Spearing, upcoming bilateral  salpingo-oophrectomy on 02/15/2012 due to BRCA2  positivity.  Case has been reviewed with Dr. Pierce Crane who also spoke with the patient.  Plan:  We will plan on seeing Samantha Mckay back in mid August 2013 to discuss adjuvant hormonal therapy. We will also obtain a baseline bone density in the event that we choose an aromatase inhibitor based therapy. This plan was reviewed with the patient, who voices understanding and agreement.  She knows to call with any changes or problems.    Samantha Mckay T, PA-C 01/12/2012

## 2012-01-22 MED ORDER — KETOROLAC TROMETHAMINE 30 MG/ML IJ SOLN
INTRAMUSCULAR | Status: AC
  Filled 2012-01-22: qty 1

## 2012-01-22 MED ORDER — NALBUPHINE SYRINGE 5 MG/0.5 ML
INJECTION | INTRAMUSCULAR | Status: AC
  Filled 2012-01-22: qty 1

## 2012-02-04 ENCOUNTER — Encounter (HOSPITAL_COMMUNITY): Payer: Self-pay | Admitting: Pharmacist

## 2012-02-08 ENCOUNTER — Encounter (HOSPITAL_COMMUNITY): Payer: Self-pay

## 2012-02-08 ENCOUNTER — Encounter (HOSPITAL_COMMUNITY)
Admission: RE | Admit: 2012-02-08 | Discharge: 2012-02-08 | Disposition: A | Discharge status: Home / Self Care | Payer: Managed Care, Other (non HMO) | Source: Ambulatory Visit | Attending: Obstetrics and Gynecology | Admitting: Obstetrics and Gynecology

## 2012-02-08 LAB — CBC
MCHC: 31.4 g/dL (ref 30.0–36.0)
RDW: 13.2 % (ref 11.5–15.5)

## 2012-02-08 LAB — SURGICAL PCR SCREEN
MRSA, PCR: NEGATIVE
Staphylococcus aureus: NEGATIVE

## 2012-02-08 NOTE — Patient Instructions (Addendum)
20 Samantha Mckay  02/08/2012   Your procedure is scheduled on:  02/15/12  Enter through the Main Entrance of [facility] at 6 AM.  Pick up the phone at the desk and dial [phone].   Call this number if you have problems the morning of surgery: [phone]   Remember:   Do not eat food:After Midnight.  Do not drink clear liquids: After Midnight.  Take these medicines the morning of surgery with A SIP OF WATER: NA   Do not wear jewelry, make-up or nail polish.  Do not wear lotions, powders, or perfumes. You may wear deodorant.  Do not shave 48 hours prior to surgery.  Do not bring valuables to the hospital.  Contacts, dentures or bridgework may not be worn into surgery.  Leave suitcase in the car. After surgery it may be brought to your room.  For patients admitted to the hospital, checkout time is 11:00 AM the day of discharge.   Patients discharged the day of surgery will not be allowed to drive home.  Name and phone number of your driver: NA  Special Instructions: CHG Shower Use Special Wash: 1/2 bottle night before surgery and 1/2 bottle morning of surgery.   Please read over the following fact sheets that you were given: MRSA Information

## 2012-02-11 ENCOUNTER — Ambulatory Visit
Admission: RE | Admit: 2012-02-11 | Discharge: 2012-02-11 | Disposition: A | Discharge status: Home / Self Care | Payer: Managed Care, Other (non HMO) | Source: Ambulatory Visit | Attending: Physician Assistant | Admitting: Physician Assistant

## 2012-02-11 DIAGNOSIS — D0510 Intraductal carcinoma in situ of unspecified breast: Secondary | ICD-10-CM

## 2012-02-11 NOTE — H&P (Addendum)
46 yo G4P4 presents for prophylactic BSO because of h/o breast  Cancer and + BRCA  PMHx:  Breast cancer PSHx:  Mastectomy, SVD x 4 All:  None Meds:  None  AF, VSS GEn - NAD CV - RRR' Lungs clear bilaterally ABd - soft, NT Ext - NT  CA 125 negative  A/P:  Plan for mirena removal, L/S BSO R/b/a discussed, informed consent obtained

## 2012-02-15 ENCOUNTER — Encounter (HOSPITAL_COMMUNITY): Payer: Self-pay | Admitting: *Deleted

## 2012-02-15 ENCOUNTER — Ambulatory Visit (HOSPITAL_COMMUNITY)
Admission: RE | Admit: 2012-02-15 | Discharge: 2012-02-15 | Disposition: A | Discharge status: Home / Self Care | Payer: Managed Care, Other (non HMO) | Source: Ambulatory Visit | Attending: Obstetrics and Gynecology | Admitting: Obstetrics and Gynecology

## 2012-02-15 ENCOUNTER — Encounter (HOSPITAL_COMMUNITY): Payer: Self-pay | Admitting: Anesthesiology

## 2012-02-15 ENCOUNTER — Encounter (HOSPITAL_COMMUNITY)
Admission: RE | Disposition: A | Discharge status: Home / Self Care | Payer: Self-pay | Source: Ambulatory Visit | Attending: Obstetrics and Gynecology

## 2012-02-15 ENCOUNTER — Ambulatory Visit (HOSPITAL_COMMUNITY): Payer: Managed Care, Other (non HMO) | Admitting: Anesthesiology

## 2012-02-15 DIAGNOSIS — Z30432 Encounter for removal of intrauterine contraceptive device: Secondary | ICD-10-CM | POA: Insufficient documentation

## 2012-02-15 DIAGNOSIS — Z4002 Encounter for prophylactic removal of ovary: Secondary | ICD-10-CM | POA: Insufficient documentation

## 2012-02-15 DIAGNOSIS — Z170 Estrogen receptor positive status [ER+]: Secondary | ICD-10-CM | POA: Insufficient documentation

## 2012-02-15 DIAGNOSIS — Z853 Personal history of malignant neoplasm of breast: Secondary | ICD-10-CM | POA: Insufficient documentation

## 2012-02-15 HISTORY — PX: IUD REMOVAL: SHX5392

## 2012-02-15 HISTORY — PX: SALPINGOOPHORECTOMY: SHX82

## 2012-02-15 HISTORY — PX: LAPAROSCOPY: SHX197

## 2012-02-15 SURGERY — LAPAROSCOPY OPERATIVE
Anesthesia: General | Site: Vagina | Wound class: Clean

## 2012-02-15 MED ORDER — OXYCODONE-ACETAMINOPHEN 5-325 MG PO TABS: 1.0000 | ORAL_TABLET | ORAL | Status: AC | PRN

## 2012-02-15 MED ORDER — FENTANYL CITRATE 0.05 MG/ML IJ SOLN
INTRAMUSCULAR | Status: DC | PRN
  Administered 2012-02-15: 100 ug via INTRAVENOUS
  Administered 2012-02-15: 50 ug via INTRAVENOUS
  Administered 2012-02-15: 100 ug via INTRAVENOUS

## 2012-02-15 MED ORDER — LIDOCAINE HCL (CARDIAC) 20 MG/ML IV SOLN
INTRAVENOUS | Status: AC
  Filled 2012-02-15: qty 5

## 2012-02-15 MED ORDER — PROPOFOL 10 MG/ML IV EMUL
INTRAVENOUS | Status: DC | PRN
  Administered 2012-02-15: 140 mg via INTRAVENOUS

## 2012-02-15 MED ORDER — MEPERIDINE HCL 25 MG/ML IJ SOLN: 6.2500 mg | INTRAMUSCULAR | Status: DC | PRN

## 2012-02-15 MED ORDER — MIDAZOLAM HCL 5 MG/5ML IJ SOLN
INTRAMUSCULAR | Status: DC | PRN
  Administered 2012-02-15: 2 mg via INTRAVENOUS

## 2012-02-15 MED ORDER — NEOSTIGMINE METHYLSULFATE 1 MG/ML IJ SOLN
INTRAMUSCULAR | Status: AC
  Filled 2012-02-15: qty 10

## 2012-02-15 MED ORDER — DEXAMETHASONE SODIUM PHOSPHATE 10 MG/ML IJ SOLN
INTRAMUSCULAR | Status: DC | PRN
  Administered 2012-02-15: 10 mg via INTRAVENOUS

## 2012-02-15 MED ORDER — ONDANSETRON HCL 4 MG/2ML IJ SOLN
INTRAMUSCULAR | Status: DC | PRN
  Administered 2012-02-15: 4 mg via INTRAVENOUS

## 2012-02-15 MED ORDER — DEXAMETHASONE SODIUM PHOSPHATE 10 MG/ML IJ SOLN
INTRAMUSCULAR | Status: AC
  Filled 2012-02-15: qty 1

## 2012-02-15 MED ORDER — LIDOCAINE HCL (CARDIAC) 20 MG/ML IV SOLN
INTRAVENOUS | Status: DC | PRN
  Administered 2012-02-15: 50 mg via INTRAVENOUS

## 2012-02-15 MED ORDER — GLYCOPYRROLATE 0.2 MG/ML IJ SOLN
INTRAMUSCULAR | Status: DC | PRN
  Administered 2012-02-15: 0.6 mg via INTRAVENOUS

## 2012-02-15 MED ORDER — CEFAZOLIN SODIUM-DEXTROSE 2-3 GM-% IV SOLR: 2.0000 g | INTRAVENOUS | Status: DC

## 2012-02-15 MED ORDER — CEFAZOLIN SODIUM-DEXTROSE 2-3 GM-% IV SOLR
INTRAVENOUS | Status: AC
  Administered 2012-02-15: 2 g via INTRAVENOUS
  Filled 2012-02-15: qty 50

## 2012-02-15 MED ORDER — PROPOFOL 10 MG/ML IV EMUL
INTRAVENOUS | Status: AC
  Filled 2012-02-15: qty 20

## 2012-02-15 MED ORDER — BUPIVACAINE HCL (PF) 0.25 % IJ SOLN
INTRAMUSCULAR | Status: AC
  Filled 2012-02-15: qty 30

## 2012-02-15 MED ORDER — ONDANSETRON HCL 4 MG/2ML IJ SOLN
INTRAMUSCULAR | Status: AC
  Filled 2012-02-15: qty 2

## 2012-02-15 MED ORDER — ROCURONIUM BROMIDE 100 MG/10ML IV SOLN
INTRAVENOUS | Status: DC | PRN
  Administered 2012-02-15: 30 mg via INTRAVENOUS

## 2012-02-15 MED ORDER — GLYCOPYRROLATE 0.2 MG/ML IJ SOLN
INTRAMUSCULAR | Status: AC
  Filled 2012-02-15: qty 2

## 2012-02-15 MED ORDER — FENTANYL CITRATE 0.05 MG/ML IJ SOLN: 25.0000 ug | INTRAMUSCULAR | Status: DC | PRN

## 2012-02-15 MED ORDER — FENTANYL CITRATE 0.05 MG/ML IJ SOLN
INTRAMUSCULAR | Status: AC
  Filled 2012-02-15: qty 5

## 2012-02-15 MED ORDER — METOCLOPRAMIDE HCL 5 MG/ML IJ SOLN: 10.0000 mg | Freq: Once | INTRAMUSCULAR | Status: DC | PRN

## 2012-02-15 MED ORDER — LACTATED RINGERS IV SOLN
INTRAVENOUS | Status: DC
  Administered 2012-02-15 (×2): via INTRAVENOUS

## 2012-02-15 MED ORDER — NEOSTIGMINE METHYLSULFATE 1 MG/ML IJ SOLN
INTRAMUSCULAR | Status: DC | PRN
  Administered 2012-02-15: 3 mg via INTRAVENOUS

## 2012-02-15 MED ORDER — BUPIVACAINE HCL (PF) 0.25 % IJ SOLN
INTRAMUSCULAR | Status: DC | PRN
  Administered 2012-02-15: 3 mL

## 2012-02-15 MED ORDER — ROCURONIUM BROMIDE 50 MG/5ML IV SOLN
INTRAVENOUS | Status: AC
  Filled 2012-02-15: qty 1

## 2012-02-15 MED ORDER — MIDAZOLAM HCL 2 MG/2ML IJ SOLN
INTRAMUSCULAR | Status: AC
  Filled 2012-02-15: qty 2

## 2012-02-15 SURGICAL SUPPLY — 29 items
BARRIER ADHS 3X4 INTERCEED (GAUZE/BANDAGES/DRESSINGS) IMPLANT
CABLE HIGH FREQUENCY MONO STRZ (ELECTRODE) IMPLANT
CATH ROBINSON RED A/P 16FR (CATHETERS) ×4 IMPLANT
CHLORAPREP W/TINT 26ML (MISCELLANEOUS) ×4 IMPLANT
CLOTH BEACON ORANGE TIMEOUT ST (SAFETY) ×4 IMPLANT
DERMABOND ADVANCED (GAUZE/BANDAGES/DRESSINGS) ×1
DERMABOND ADVANCED .7 DNX12 (GAUZE/BANDAGES/DRESSINGS) ×3 IMPLANT
GLOVE BIO SURGEON STRL SZ 6.5 (GLOVE) ×4 IMPLANT
GLOVE BIOGEL PI IND STRL 7.0 (GLOVE) ×9 IMPLANT
GLOVE BIOGEL PI INDICATOR 7.0 (GLOVE) ×3
GLOVE ECLIPSE 6.0 STRL STRAW (GLOVE) ×4 IMPLANT
GOWN PREVENTION PLUS LG XLONG (DISPOSABLE) ×8 IMPLANT
NS IRRIG 1000ML POUR BTL (IV SOLUTION) ×4 IMPLANT
PACK LAPAROSCOPY BASIN (CUSTOM PROCEDURE TRAY) ×4 IMPLANT
POUCH SPECIMEN RETRIEVAL 10MM (ENDOMECHANICALS) ×8 IMPLANT
PROTECTOR NERVE ULNAR (MISCELLANEOUS) ×4 IMPLANT
SEALER TISSUE G2 CVD JAW 35 (ENDOMECHANICALS) IMPLANT
SEALER TISSUE G2 CVD JAW 45CM (ENDOMECHANICALS) ×4 IMPLANT
SET IRRIG TUBING LAPAROSCOPIC (IRRIGATION / IRRIGATOR) IMPLANT
SLEEVE Z-THREAD 5X100MM (TROCAR) IMPLANT
STRIP CLOSURE SKIN 1/2X4 (GAUZE/BANDAGES/DRESSINGS) IMPLANT
SUT VIC AB 3-0 PS2 18 (SUTURE) ×1
SUT VIC AB 3-0 PS2 18XBRD (SUTURE) ×3 IMPLANT
SUT VICRYL 0 UR6 27IN ABS (SUTURE) ×4 IMPLANT
TOWEL OR 17X24 6PK STRL BLUE (TOWEL DISPOSABLE) ×8 IMPLANT
TROCAR Z-THREAD BLADED 5X100MM (TROCAR) ×4 IMPLANT
TROCAR Z-THREAD FIOS 11X100 BL (TROCAR) ×4 IMPLANT
WARMER LAPAROSCOPE (MISCELLANEOUS) ×4 IMPLANT
WATER STERILE IRR 1000ML POUR (IV SOLUTION) IMPLANT

## 2012-02-15 NOTE — Anesthesia Preprocedure Evaluation (Addendum)
Anesthesia Evaluation  Patient identified by MRN, date of birth, ID band Patient awake    Reviewed: Allergy & Precautions, H&P , NPO status , Patient's Chart, lab work & pertinent test results  Airway Mallampati: II TM Distance: >3 FB Neck ROM: Full    Dental No notable dental hx. (+) Teeth Intact   Pulmonary neg pulmonary ROS,  breath sounds clear to auscultation  Pulmonary exam normal       Cardiovascular negative cardio ROS  Rhythm:Regular Rate:Normal     Neuro/Psych negative neurological ROS  negative psych ROS   GI/Hepatic negative GI ROS, Liver Mass   Endo/Other  Hx/o Breast Ca  Renal/GU negative Renal ROS  negative genitourinary   Musculoskeletal negative musculoskeletal ROS (+)   Abdominal   Peds  Hematology negative hematology ROS (+)   Anesthesia Other Findings   Reproductive/Obstetrics negative OB ROS                          Anesthesia Physical Anesthesia Plan  ASA: II  Anesthesia Plan: General   Post-op Pain Management:    Induction: Intravenous  Airway Management Planned: Oral ETT  Additional Equipment:   Intra-op Plan:   Post-operative Plan: Extubation in OR  Informed Consent: I have reviewed the patients History and Physical, chart, labs and discussed the procedure including the risks, benefits and alternatives for the proposed anesthesia with the patient or authorized representative who has indicated his/her understanding and acceptance.   Dental advisory given  Plan Discussed with: CRNA, Anesthesiologist and Surgeon  Anesthesia Plan Comments:         Anesthesia Quick Evaluation

## 2012-02-15 NOTE — Transfer of Care (Signed)
Immediate Anesthesia Transfer of Care Note  Patient: Samantha Mckay  Procedure(s) Performed: Procedure(s) (LRB): LAPAROSCOPY OPERATIVE (N/A) SALPINGO OOPHERECTOMY (Bilateral) INTRAUTERINE DEVICE (IUD) REMOVAL (N/A)  Patient Location: PACU  Anesthesia Type: General  Level of Consciousness: sedated  Airway & Oxygen Therapy: Patient Spontanous Breathing and Patient connected to nasal cannula oxygen  Post-op Assessment: Report given to PACU RN and Post -op Vital signs reviewed and stable  Post vital signs: stable  Complications: No apparent anesthesia complications

## 2012-02-15 NOTE — Anesthesia Postprocedure Evaluation (Signed)
Anesthesia Post Note  Patient: Samantha Mckay  Procedure(s) Performed: Procedure(s) (LRB): LAPAROSCOPY OPERATIVE (N/A) SALPINGO OOPHERECTOMY (Bilateral) INTRAUTERINE DEVICE (IUD) REMOVAL (N/A)  Anesthesia type: GA  Patient location: PACU  Post pain: Pain level controlled  Post assessment: Post-op Vital signs reviewed  Last Vitals:  Filed Vitals:   02/15/12 0930  BP: 98/65  Pulse: 58  Temp: 36.4 C  Resp: 14    Post vital signs: Reviewed  Level of consciousness: sedated  Complications: No apparent anesthesia complications

## 2012-02-15 NOTE — Op Note (Signed)
NAME:  Samantha Mckay, Samantha Mckay NO.:  [id number]  MEDICAL RECORD NO.:  [id number]  LOCATION:  [facility]                          FACILITY:  [facility]  PHYSICIAN:  Zelphia Cairo, MD    DATE OF BIRTH:  08-26-1965  DATE OF PROCEDURE:  02/15/2012 DATE OF DISCHARGE:                              OPERATIVE REPORT   PREOPERATIVE DIAGNOSES: 1. BRCA positive with history of breast cancer. 2. Desires bilateral salpingo-oophorectomy.  PROCEDURES: 1. Mirena IUD removal. 2. Laparoscopic bilateral salpingo-oophorectomy.  SURGEON:  Zelphia Cairo, MD.  ANESTHESIA:  General.  BLOOD LOSS:  Minimal.  SPECIMEN:  Bilateral fallopian tubes and ovaries.  COMPLICATIONS:  None.  CONDITION:  Stable and extubated to recovery room.  PROCEDURE IN DETAIL:  The patient was taken to the operating room after informed consent was obtained.  She was placed in the dorsal lithotomy position using Allen stirrups.  She was prepped and draped in sterile fashion.  An in and out catheter was used to drain her bladder.  Bivalve speculum was placed in the vagina and ring forceps were used to grasp the IUD strings.  The IUD was removed and placed in the trash can for disposal.  Single-tooth tenaculum was then placed on the anterior lip of the cervix, and Hulka clamp was attached to the cervix.  The tenaculum and speculum were removed and our attention was turned to the abdomen.  A small 0.25% Marcaine was used for local anesthesia at the site of both of our trocar incisions.  A small incision was made just beneath the umbilicus and extended bluntly to the level of the fascia using a Kelly clamp.  Optical trocar was then inserted under direct visualization. Once the intraperitoneal placement was confirmed, CO2 was turned on and the abdomen and pelvis were surveyed.  Bilateral ovaries and fallopian tubes were visualized.  The uterus was manipulated to the patient's right and the left adnexa was grasped, tented  upwards, and toward the midline.  The left ureter was visualized and noted to be free of our operative field.  Suprapubic incision was made, and trocar was inserted under direct visualization.  The left ovary and fallopian tube were then transected free of the uterus using the enseal device.  The specimen was then placed in the anterior cul-de-sac.  The junction between the fallopian tube and uterus were then cauterized significantly.  Our attention was then turned to the patient's right side.  The uterus was manipulated to the left and the right adnexa was grasped with atraumatic graspers and tented upwards and toward the midline.  The enseal device was used to cauterize and cut the mesosalpinx and utero- ovarian ligament.  This was transected free from the uterus and placed in the posterior cul-de-sac.  The cornual region of the uterus was then cauterized extensively.  Both adnexa were placed in separate specimen bags and removed from the pelvis and sent off to Pathology.  Hemostasis was noted throughout the pelvis.  All instruments and trocars were removed.  Fascia was reapproximated with figure-of-eight stitch of Vicryl and the skin was closed with Vicryl as well.  Dermabond was placed over the incision.  Hulka clamp was removed.  The patient was extubated  and taken to the recovery room in stable condition.  Sponge, lap, needle, and instrument counts were correct x2.     Zelphia Cairo, MD     GA/MEDQ  D:  02/15/2012  T:  02/15/2012  Job:  [id number]

## 2012-02-16 ENCOUNTER — Encounter (HOSPITAL_COMMUNITY): Payer: Self-pay | Admitting: Obstetrics and Gynecology

## 2012-03-18 ENCOUNTER — Ambulatory Visit (HOSPITAL_BASED_OUTPATIENT_CLINIC_OR_DEPARTMENT_OTHER): Payer: Managed Care, Other (non HMO) | Admitting: Oncology

## 2012-03-18 ENCOUNTER — Telehealth: Payer: Self-pay | Admitting: Oncology

## 2012-03-18 ENCOUNTER — Other Ambulatory Visit (HOSPITAL_BASED_OUTPATIENT_CLINIC_OR_DEPARTMENT_OTHER): Payer: Managed Care, Other (non HMO) | Admitting: Lab

## 2012-03-18 VITALS — BP 103/73 | HR 79 | Temp 97.2°F | Resp 20 | Ht 61.0 in | Wt 157.2 lb

## 2012-03-18 DIAGNOSIS — D0510 Intraductal carcinoma in situ of unspecified breast: Secondary | ICD-10-CM

## 2012-03-18 DIAGNOSIS — D0590 Unspecified type of carcinoma in situ of unspecified breast: Secondary | ICD-10-CM

## 2012-03-18 DIAGNOSIS — C50919 Malignant neoplasm of unspecified site of unspecified female breast: Secondary | ICD-10-CM

## 2012-03-18 LAB — CBC WITH DIFFERENTIAL/PLATELET
BASO%: 0.4 % (ref 0.0–2.0)
Eosinophils Absolute: 0.2 10*3/uL (ref 0.0–0.5)
HCT: 38.9 % (ref 34.8–46.6)
LYMPH%: 47.7 % (ref 14.0–49.7)
MCHC: 32.6 g/dL (ref 31.5–36.0)
MCV: 83.2 fL (ref 79.5–101.0)
MONO#: 0.3 10*3/uL (ref 0.1–0.9)
MONO%: 10.2 % (ref 0.0–14.0)
NEUT%: 36.9 % — ABNORMAL LOW (ref 38.4–76.8)
Platelets: 173 10*3/uL (ref 145–400)
WBC: 3.4 10*3/uL — ABNORMAL LOW (ref 3.9–10.3)

## 2012-03-18 LAB — COMPREHENSIVE METABOLIC PANEL
CO2: 27 mEq/L (ref 19–32)
Creatinine, Ser: 0.89 mg/dL (ref 0.50–1.10)
Glucose, Bld: 80 mg/dL (ref 70–99)
Total Bilirubin: 0.5 mg/dL (ref 0.3–1.2)

## 2012-03-18 MED ORDER — ANASTROZOLE 1 MG PO TABS: 1.0000 mg | ORAL_TABLET | Freq: Every day | ORAL | Status: DC

## 2012-03-18 NOTE — Telephone Encounter (Signed)
gve the pt her nov 2013 appt calendar °

## 2012-03-18 NOTE — Progress Notes (Signed)
Hematology and Oncology Follow Up Visit  Latica Hohmann [id number] 11/16/1965 46 y.o. 03/18/2012    HPI: Mrs. Jemison is a 46 year-old 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  In the right simple mastectomy with associated 2 lymph node showed no evidence of malignancy. She has undergone bilateral breast reconstruction under the care of Dr. Shella Spearing, upcoming bilateral  salpingo-oophrectomy on 02/15/2012 due to BRCA2 positivity.  Interim History:   Kevonna is seen today for followup. As noted above, she'll be undergoing bilateral salpingo-oophorectomy on 02/15/2012 due to BRCA2 positivity under the care of Dr. Esperanza Richters. She also has had her final expansion performed on her bilateral breast reconstruction sites under the care of Dr. Kelly Splinter.. She is feeling well, denying any unexplained fevers, chills, or night sweats. No shortness of breath, or chest pain. No nausea, emesis, diarrhea, or constipation issues.A detailed review of systems is otherwise noncontributory as noted below.  Review of Systems: Constitutional:  no weight loss, fever, night sweats and feels well Eyes: no complaints ENT: no complaints Cardiovascular: no chest pain or dyspnea on exertion Respiratory: no cough, shortness of breath, or wheezing Neurological: no TIA or stroke symptoms Dermatological: negative Gastrointestinal: no abdominal pain, change in bowel habits, or black or bloody stools Genito-Urinary: no dysuria, trouble voiding, or hematuria Hematological and Lymphatic: negative Breast: s/p B mastectomies w/reconstruction Musculoskeletal: negative Remaining ROS negative.  Medications:   I have reviewed the patient's current  medications.  Current Outpatient Prescriptions  Medication Sig Dispense Refill  . cholecalciferol (VITAMIN D) 1000 UNITS tablet Take 1,000 Units by mouth 2 (two) times daily.      Marland Kitchen ibuprofen (ADVIL,MOTRIN) 200 MG tablet Take 200 mg by mouth every 6 (six) hours as needed. For pain      . Multiple Vitamin (MULTIVITAMIN) tablet Take 1 tablet by mouth daily.        Allergies: No Known Allergies  Physical Exam: Filed Vitals:   03/18/12 1127  BP: 103/73  Pulse: 79  Temp: 97.2 F (36.2 C)  Resp: 20    Body mass index is 29.70 kg/(m^2). Weight: 160lbs.  Full exam deferred.   Lab Results: Lab Results  Component Value Date   WBC 3.4* 03/18/2012   HGB 12.7 03/18/2012   HCT 38.9 03/18/2012   MCV 83.2 03/18/2012   PLT 173 03/18/2012   NEUTROABS 1.2* 03/18/2012     Chemistry      Component Value Date/Time   NA 141 10/05/2011 0959   K 4.7 10/05/2011 0959   CL 108 10/05/2011 0959   CO2 25 10/05/2011 0959   BUN 12 10/05/2011 0959   CREATININE 0.78 10/05/2011 0959      Component Value Date/Time   CALCIUM 8.8 10/05/2011 0959   ALKPHOS 66 10/05/2011 0959   AST 30 10/05/2011 0959   ALT 24 10/05/2011 0959   BILITOT 0.2* 10/05/2011 0959      Lab Results  Component Value Date   LABCA2 13 10/05/2011     Assessment:  Mrs. Wheller is a 46 year-old 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  She underwent bilateral simple mastectomies with sentinel/axillary lymph node dissection on 09/21/2011 which showed 1/17 lymph nodes positive for metastatic ductal  carcinoma.  In the right simple mastectomy with associated 2 lymph node showed no evidence of malignancy.  Status post bilateral salpingo-oophorectomy 02/15/2012, NED  She has been taking Arimidex. She seems to be doing well with this. We did do a bone density test. This is within normal limits. We did attempt to get other tissue for IHC testing for her to 40  there was inadequate tissue.  Plan:  Tamico is doing well. We discussed initiation of AI therapy with Arimidex. I discussed side effects with her. I plan to see her back in 3 months time for followup.    Joelle Roswell MD 03/18/2012

## 2012-03-18 NOTE — Patient Instructions (Signed)
WE WILL START ARIMIDEX ( ANASTRAZOLE) TODAY, EXPECTED SIDE EFFECTS, ARE HOT FLASHES AND BONE PAINS.Marland Kitchen

## 2012-04-27 ENCOUNTER — Encounter (INDEPENDENT_AMBULATORY_CARE_PROVIDER_SITE_OTHER): Payer: Self-pay | Admitting: Surgery

## 2012-06-16 ENCOUNTER — Other Ambulatory Visit: Payer: Self-pay | Admitting: *Deleted

## 2012-06-16 DIAGNOSIS — C50919 Malignant neoplasm of unspecified site of unspecified female breast: Secondary | ICD-10-CM

## 2012-06-21 ENCOUNTER — Other Ambulatory Visit (HOSPITAL_BASED_OUTPATIENT_CLINIC_OR_DEPARTMENT_OTHER): Payer: Self-pay | Admitting: Lab

## 2012-06-21 ENCOUNTER — Ambulatory Visit (HOSPITAL_BASED_OUTPATIENT_CLINIC_OR_DEPARTMENT_OTHER): Payer: Self-pay | Admitting: Oncology

## 2012-06-21 VITALS — BP 111/74 | HR 81 | Temp 98.5°F | Resp 20 | Ht 61.0 in | Wt 167.8 lb

## 2012-06-21 DIAGNOSIS — D0590 Unspecified type of carcinoma in situ of unspecified breast: Secondary | ICD-10-CM

## 2012-06-21 DIAGNOSIS — C50919 Malignant neoplasm of unspecified site of unspecified female breast: Secondary | ICD-10-CM

## 2012-06-21 LAB — COMPREHENSIVE METABOLIC PANEL (CC13)
ALT: 18 U/L (ref 0–55)
BUN: 16 mg/dL (ref 7.0–26.0)
CO2: 29 mEq/L (ref 22–29)
Calcium: 9 mg/dL (ref 8.4–10.4)
Chloride: 107 mEq/L (ref 98–107)
Creatinine: 1 mg/dL (ref 0.6–1.1)

## 2012-06-21 LAB — CBC WITH DIFFERENTIAL/PLATELET
Basophils Absolute: 0 10*3/uL (ref 0.0–0.1)
Eosinophils Absolute: 0.1 10*3/uL (ref 0.0–0.5)
HCT: 37 % (ref 34.8–46.6)
LYMPH%: 47.5 % (ref 14.0–49.7)
MONO#: 0.4 10*3/uL (ref 0.1–0.9)
NEUT#: 1.5 10*3/uL (ref 1.5–6.5)
NEUT%: 38.2 % — ABNORMAL LOW (ref 38.4–76.8)
Platelets: 184 10*3/uL (ref 145–400)
WBC: 4 10*3/uL (ref 3.9–10.3)

## 2012-06-21 NOTE — Progress Notes (Signed)
Hematology and Oncology Follow Up Visit  Samantha Mckay [id number] 12-Jun-1966 46 y.o. 06/21/2012    HPI: Samantha Mckay is a 46 year-old 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  In the right simple mastectomy with associated 2 lymph node showed no evidence of malignancy. She has undergone bilateral breast reconstruction under the care of Dr. Shella Spearing, upcoming bilateral  salpingo-oophrectomy on 02/15/2012 due to BRCA2 positivity.  Interim History:   Samantha Mckay is seen today for followup.she is on arimidex which she tolerates well. She is back working and going to school part time. She is taking vitamin D intermittently.   Review of Systems: Constitutional:  no weight loss, fever, night sweats and feels well Eyes: no complaints ENT: no complaints Cardiovascular: no chest pain or dyspnea on exertion Respiratory: no cough, shortness of breath, or wheezing Neurological: no TIA or stroke symptoms Dermatological: negative Gastrointestinal: no abdominal pain, change in bowel habits, or black or bloody stools Genito-Urinary: no dysuria, trouble voiding, or hematuria Hematological and Lymphatic: negative Breast: s/p B mastectomies w/reconstruction Musculoskeletal: negative Remaining ROS negative.  Medications:   I have reviewed the patient's current medications.  Current Outpatient Prescriptions  Medication Sig Dispense Refill  . cholecalciferol (VITAMIN D) 1000 UNITS tablet Take 1,000 Units by mouth 2 (two) times daily.      Marland Kitchen ibuprofen (ADVIL,MOTRIN) 200 MG tablet Take 200 mg by mouth every 6 (six) hours as needed. For pain      . Multiple Vitamin (MULTIVITAMIN) tablet Take 1 tablet by mouth daily.        Allergies: No  Known Allergies  Physical Exam: Filed Vitals:   06/21/12 1551  BP: 111/74  Pulse: 81  Temp: 98.5 F (36.9 C)  Resp: 20    Body mass index is 31.71 kg/(m^2). Weight: 160lbs.  HEENT:  Sclerae anicteric, conjunctivae pink.  Oropharynx clear.  No mucositis or candidiasis.  Nodes:  No cervical, supraclavicular, or axillary lymphadenopathy palpated.  Breast Exam: s/p bilateral mastectomies with expanders in place, pending permanent implant placement. .  Lungs:  Clear to auscultation bilaterally.  No crackles, rhonchi, or wheezes.  Heart:  Regular rate and rhythm.  Abdomen:  Soft, nontender.  Positive bowel sounds.  No organomegaly or masses palpated.  Musculoskeletal:  No focal spinal tenderness to palpation.  Extremities:  Benign.  No peripheral edema or cyanosis.  Skin:  Benign.  Neuro:  Nonfocal.    Lab Results: Lab Results  Component Value Date   WBC 4.0 06/21/2012   HGB 11.9 06/21/2012   HCT 37.0 06/21/2012   MCV 84.3 06/21/2012   PLT 184 06/21/2012   NEUTROABS 1.5 06/21/2012     Chemistry      Component Value Date/Time   NA 143 06/21/2012 1536   NA 141 03/18/2012 1108   K 4.6 06/21/2012 1536   K 4.7 03/18/2012 1108   CL 107 06/21/2012 1536   CL 106 03/18/2012 1108   CO2 29 06/21/2012 1536   CO2 27 03/18/2012 1108   BUN 16.0 06/21/2012 1536   BUN 14 03/18/2012 1108   CREATININE 1.0 06/21/2012 1536   CREATININE 0.89 03/18/2012 1108      Component Value Date/Time   CALCIUM 9.0 06/21/2012 1536   CALCIUM 9.2 03/18/2012 1108   ALKPHOS 87 06/21/2012 1536  ALKPHOS 65 03/18/2012 1108   AST 20 06/21/2012 1536   AST 25 03/18/2012 1108   ALT 18 06/21/2012 1536   ALT 27 03/18/2012 1108   BILITOT 0.30 06/21/2012 1536   BILITOT 0.5 03/18/2012 1108      Lab Results  Component Value Date   LABCA2 13 10/05/2011     Assessment:  Samantha Mckay is a 46 year-old 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  In the right simple mastectomy with associated 2 lymph node showed no evidence of malignancy.  Status post bilateral salpingo-oophorectomy 02/15/2012, NED  She has been taking Arimidex. She seems to be doing well with this.   Plan:  Ader is doing well.  I discussed side effects with her. I plan to see her back in 3 months time for followup.    Samantha Figge MD 06/21/2012

## 2012-07-14 ENCOUNTER — Ambulatory Visit (INDEPENDENT_AMBULATORY_CARE_PROVIDER_SITE_OTHER): Payer: Managed Care, Other (non HMO) | Admitting: Surgery

## 2012-08-16 ENCOUNTER — Ambulatory Visit (INDEPENDENT_AMBULATORY_CARE_PROVIDER_SITE_OTHER): Payer: Self-pay | Admitting: Surgery

## 2012-10-04 ENCOUNTER — Encounter (INDEPENDENT_AMBULATORY_CARE_PROVIDER_SITE_OTHER): Payer: Self-pay | Admitting: Surgery

## 2012-10-04 ENCOUNTER — Ambulatory Visit (INDEPENDENT_AMBULATORY_CARE_PROVIDER_SITE_OTHER): Payer: Managed Care, Other (non HMO) | Admitting: Surgery

## 2012-10-04 VITALS — BP 112/72 | HR 74 | Temp 98.2°F | Resp 16 | Ht 61.0 in | Wt 163.2 lb

## 2012-10-04 DIAGNOSIS — D0512 Intraductal carcinoma in situ of left breast: Secondary | ICD-10-CM

## 2012-10-04 DIAGNOSIS — D0590 Unspecified type of carcinoma in situ of unspecified breast: Secondary | ICD-10-CM

## 2012-10-04 NOTE — Progress Notes (Signed)
The patient is here for long-term followup after bilateral mastectomies February of 2013. She had large volume DCIS of left breast with microinvasive disease in the left axilla and a single lymph node. She underwent immediate reconstruction with tissue expanders by Dr. Kelly Splinter. She is currently on Arimidex. She was under the care of Dr. Donnie Coffin and has not yet been assigned to a new oncologist.She is doing quite well.  Her incisions are well-healed. The tissue expanders seen fully expanded and are quite firm. No palpable masses around the periphery. No axillary lymphadenopathy on either side.  The patient is scheduled to see Dr. Kelly Splinter later this week to discuss her implants. She may continue to followup with oncology for hormone treatment. We will be glad to see her back as needed but she is very little breast tissue remaining for [place] to examine.  Wilmon Arms. Corliss Skains, MD, [facility]  10/04/2012 4:00 PM

## 2012-10-12 ENCOUNTER — Encounter: Payer: Self-pay | Admitting: Oncology

## 2012-10-12 NOTE — Progress Notes (Signed)
Faxed pa form for anastrozole 1mg  to [facility] @ [phone].

## 2012-10-26 ENCOUNTER — Encounter: Payer: Self-pay | Admitting: Oncology

## 2012-10-26 NOTE — Progress Notes (Signed)
Emailed EPP application to pt today as requested.

## 2012-11-10 ENCOUNTER — Encounter (HOSPITAL_BASED_OUTPATIENT_CLINIC_OR_DEPARTMENT_OTHER): Payer: Self-pay | Admitting: *Deleted

## 2012-11-10 NOTE — Progress Notes (Signed)
No labs needed

## 2012-11-11 DIAGNOSIS — Z9013 Acquired absence of bilateral breasts and nipples: Secondary | ICD-10-CM | POA: Insufficient documentation

## 2012-11-17 ENCOUNTER — Other Ambulatory Visit: Payer: Self-pay | Admitting: Plastic Surgery

## 2012-11-17 ENCOUNTER — Encounter (HOSPITAL_BASED_OUTPATIENT_CLINIC_OR_DEPARTMENT_OTHER): Payer: Self-pay | Admitting: Anesthesiology

## 2012-11-17 ENCOUNTER — Encounter (HOSPITAL_BASED_OUTPATIENT_CLINIC_OR_DEPARTMENT_OTHER): Payer: Self-pay | Admitting: *Deleted

## 2012-11-17 ENCOUNTER — Ambulatory Visit (HOSPITAL_BASED_OUTPATIENT_CLINIC_OR_DEPARTMENT_OTHER)
Admission: RE | Admit: 2012-11-17 | Discharge: 2012-11-17 | Disposition: A | Discharge status: Home / Self Care | Payer: Managed Care, Other (non HMO) | Source: Ambulatory Visit | Attending: Plastic Surgery | Admitting: Plastic Surgery

## 2012-11-17 ENCOUNTER — Ambulatory Visit (HOSPITAL_BASED_OUTPATIENT_CLINIC_OR_DEPARTMENT_OTHER): Payer: Managed Care, Other (non HMO) | Admitting: Anesthesiology

## 2012-11-17 ENCOUNTER — Encounter (HOSPITAL_BASED_OUTPATIENT_CLINIC_OR_DEPARTMENT_OTHER)
Admission: RE | Disposition: A | Discharge status: Home / Self Care | Payer: Self-pay | Source: Ambulatory Visit | Attending: Plastic Surgery

## 2012-11-17 DIAGNOSIS — Z9013 Acquired absence of bilateral breasts and nipples: Secondary | ICD-10-CM

## 2012-11-17 DIAGNOSIS — Z421 Encounter for breast reconstruction following mastectomy: Secondary | ICD-10-CM | POA: Insufficient documentation

## 2012-11-17 DIAGNOSIS — Z9010 Acquired absence of unspecified breast and nipple: Secondary | ICD-10-CM | POA: Insufficient documentation

## 2012-11-17 DIAGNOSIS — Z853 Personal history of malignant neoplasm of breast: Secondary | ICD-10-CM | POA: Insufficient documentation

## 2012-11-17 HISTORY — PX: BREAST IMPLANT EXCHANGE: SHX6296

## 2012-11-17 LAB — POCT HEMOGLOBIN-HEMACUE: Hemoglobin: 13.1 g/dL (ref 12.0–15.0)

## 2012-11-17 SURGERY — REPLACEMENT, IMPLANT, BREAST
Anesthesia: General | Site: Breast | Laterality: Bilateral | Wound class: Clean

## 2012-11-17 MED ORDER — OXYCODONE HCL 5 MG PO TABS
5.0000 mg | ORAL_TABLET | Freq: Once | ORAL | Status: AC | PRN
  Administered 2012-11-17: 5 mg via ORAL

## 2012-11-17 MED ORDER — FENTANYL CITRATE 0.05 MG/ML IJ SOLN
INTRAMUSCULAR | Status: DC | PRN
  Administered 2012-11-17: 100 ug via INTRAVENOUS
  Administered 2012-11-17: 25 ug via INTRAVENOUS

## 2012-11-17 MED ORDER — CEFAZOLIN SODIUM-DEXTROSE 2-3 GM-% IV SOLR
2.0000 g | INTRAVENOUS | Status: AC
  Administered 2012-11-17: 2 g via INTRAVENOUS

## 2012-11-17 MED ORDER — SUCCINYLCHOLINE CHLORIDE 20 MG/ML IJ SOLN
INTRAMUSCULAR | Status: DC | PRN
  Administered 2012-11-17: 100 mg via INTRAVENOUS

## 2012-11-17 MED ORDER — DEXAMETHASONE SODIUM PHOSPHATE 4 MG/ML IJ SOLN
INTRAMUSCULAR | Status: DC | PRN
  Administered 2012-11-17: 8 mg via INTRAVENOUS

## 2012-11-17 MED ORDER — ACETAMINOPHEN 10 MG/ML IV SOLN
1000.0000 mg | Freq: Once | INTRAVENOUS | Status: AC
  Administered 2012-11-17: 1000 mg via INTRAVENOUS

## 2012-11-17 MED ORDER — PROPOFOL 10 MG/ML IV BOLUS
INTRAVENOUS | Status: DC | PRN
  Administered 2012-11-17: 200 mg via INTRAVENOUS

## 2012-11-17 MED ORDER — PHENYLEPHRINE HCL 10 MG/ML IJ SOLN
INTRAMUSCULAR | Status: DC | PRN
  Administered 2012-11-17 (×2): 40 ug via INTRAVENOUS

## 2012-11-17 MED ORDER — LACTATED RINGERS IV SOLN
INTRAVENOUS | Status: DC
  Administered 2012-11-17 (×2): via INTRAVENOUS

## 2012-11-17 MED ORDER — HYDROMORPHONE HCL PF 1 MG/ML IJ SOLN: 0.2500 mg | INTRAMUSCULAR | Status: DC | PRN

## 2012-11-17 MED ORDER — MIDAZOLAM HCL 2 MG/2ML IJ SOLN: 1.0000 mg | INTRAMUSCULAR | Status: DC | PRN

## 2012-11-17 MED ORDER — MIDAZOLAM HCL 5 MG/5ML IJ SOLN
INTRAMUSCULAR | Status: DC | PRN
  Administered 2012-11-17: 2 mg via INTRAVENOUS

## 2012-11-17 MED ORDER — SODIUM CHLORIDE 0.9 % IR SOLN
Status: DC | PRN
  Administered 2012-11-17: 08:00:00

## 2012-11-17 MED ORDER — OXYCODONE HCL 5 MG/5ML PO SOLN: 5.0000 mg | Freq: Once | ORAL | Status: AC | PRN

## 2012-11-17 MED ORDER — LIDOCAINE HCL (CARDIAC) 20 MG/ML IV SOLN
INTRAVENOUS | Status: DC | PRN
  Administered 2012-11-17: 100 mg via INTRAVENOUS

## 2012-11-17 MED ORDER — ONDANSETRON HCL 4 MG/2ML IJ SOLN: 4.0000 mg | Freq: Once | INTRAMUSCULAR | Status: DC | PRN

## 2012-11-17 MED ORDER — FENTANYL CITRATE 0.05 MG/ML IJ SOLN: 50.0000 ug | INTRAMUSCULAR | Status: DC | PRN

## 2012-11-17 SURGICAL SUPPLY — 63 items
BAG DECANTER FOR FLEXI CONT (MISCELLANEOUS) ×2 IMPLANT
BANDAGE GAUZE ELAST BULKY 4 IN (GAUZE/BANDAGES/DRESSINGS) ×4 IMPLANT
BINDER BREAST LRG (GAUZE/BANDAGES/DRESSINGS) IMPLANT
BINDER BREAST MEDIUM (GAUZE/BANDAGES/DRESSINGS) ×2 IMPLANT
BINDER BREAST XLRG (GAUZE/BANDAGES/DRESSINGS) IMPLANT
BINDER BREAST XXLRG (GAUZE/BANDAGES/DRESSINGS) IMPLANT
BIOPATCH BLUE 3/4IN DISK W/1.5 (GAUZE/BANDAGES/DRESSINGS) IMPLANT
BLADE HEX COATED 2.75 (ELECTRODE) ×2 IMPLANT
BLADE SURG 15 STRL LF DISP TIS (BLADE) ×1 IMPLANT
BLADE SURG 15 STRL SS (BLADE) ×1
CANISTER SUCTION 1200CC (MISCELLANEOUS) ×4 IMPLANT
CHLORAPREP W/TINT 26ML (MISCELLANEOUS) ×2 IMPLANT
CLOTH BEACON ORANGE TIMEOUT ST (SAFETY) ×2 IMPLANT
CORDS BIPOLAR (ELECTRODE) IMPLANT
COVER MAYO STAND STRL (DRAPES) ×2 IMPLANT
COVER TABLE BACK 60X90 (DRAPES) ×2 IMPLANT
DECANTER SPIKE VIAL GLASS SM (MISCELLANEOUS) IMPLANT
DERMABOND ADVANCED (GAUZE/BANDAGES/DRESSINGS) ×2
DERMABOND ADVANCED .7 DNX12 (GAUZE/BANDAGES/DRESSINGS) ×2 IMPLANT
DRAIN CHANNEL 19F RND (DRAIN) IMPLANT
DRAPE LAPAROSCOPIC ABDOMINAL (DRAPES) ×2 IMPLANT
DRSG PAD ABDOMINAL 8X10 ST (GAUZE/BANDAGES/DRESSINGS) ×4 IMPLANT
DRSG TEGADERM 2-3/8X2-3/4 SM (GAUZE/BANDAGES/DRESSINGS) IMPLANT
ELECT BLADE 4.0 EZ CLEAN MEGAD (MISCELLANEOUS) ×2
ELECT REM PT RETURN 9FT ADLT (ELECTROSURGICAL) ×2
ELECTRODE BLDE 4.0 EZ CLN MEGD (MISCELLANEOUS) ×1 IMPLANT
ELECTRODE REM PT RTRN 9FT ADLT (ELECTROSURGICAL) ×1 IMPLANT
EVACUATOR SILICONE 100CC (DRAIN) IMPLANT
GAUZE SPONGE 4X4 12PLY STRL LF (GAUZE/BANDAGES/DRESSINGS) IMPLANT
GLOVE BIO SURGEON STRL SZ 6.5 (GLOVE) ×8 IMPLANT
GLOVE SKINSENSE NS SZ6.5 (GLOVE)
GLOVE SKINSENSE NS SZ7.0 (GLOVE) ×1
GLOVE SKINSENSE STRL SZ6.5 (GLOVE) IMPLANT
GLOVE SKINSENSE STRL SZ7.0 (GLOVE) ×1 IMPLANT
GOWN PREVENTION PLUS XLARGE (GOWN DISPOSABLE) ×6 IMPLANT
IMPL BREAST GEL HP 400CC (Breast) ×2 IMPLANT
IMPLANT BREAST GEL HP 400CC (Breast) ×4 IMPLANT
IMPLANT BREAST UHP 430CC (SIZER) ×2 IMPLANT
IV NS 1000ML (IV SOLUTION)
IV NS 1000ML BAXH (IV SOLUTION) IMPLANT
IV NS 500ML (IV SOLUTION)
IV NS 500ML BAXH (IV SOLUTION) IMPLANT
KIT FILL MCGHAN 30CC (MISCELLANEOUS) IMPLANT
NDL SAFETY ECLIPSE 18X1.5 (NEEDLE) ×1 IMPLANT
NEEDLE HYPO 18GX1.5 SHARP (NEEDLE) ×1
NEEDLE HYPO 25X1 1.5 SAFETY (NEEDLE) IMPLANT
PACK BASIN DAY SURGERY FS (CUSTOM PROCEDURE TRAY) ×2 IMPLANT
PENCIL BUTTON HOLSTER BLD 10FT (ELECTRODE) ×2 IMPLANT
PIN SAFETY STERILE (MISCELLANEOUS) IMPLANT
SLEEVE SCD COMPRESS KNEE MED (MISCELLANEOUS) ×2 IMPLANT
SPONGE LAP 18X18 X RAY DECT (DISPOSABLE) ×4 IMPLANT
SUT MON AB 5-0 PS2 18 (SUTURE) ×4 IMPLANT
SUT PDS AB 2-0 CT2 27 (SUTURE) IMPLANT
SUT VIC AB 3-0 SH 27 (SUTURE) ×3
SUT VIC AB 3-0 SH 27X BRD (SUTURE) ×3 IMPLANT
SUT VICRYL 4-0 PS2 18IN ABS (SUTURE) ×4 IMPLANT
SYR 50ML LL SCALE MARK (SYRINGE) IMPLANT
SYR BULB IRRIGATION 50ML (SYRINGE) ×2 IMPLANT
SYR CONTROL 10ML LL (SYRINGE) IMPLANT
TOWEL OR 17X24 6PK STRL BLUE (TOWEL DISPOSABLE) ×4 IMPLANT
TUBE CONNECTING 20X1/4 (TUBING) ×2 IMPLANT
UNDERPAD 30X30 INCONTINENT (UNDERPADS AND DIAPERS) ×4 IMPLANT
YANKAUER SUCT BULB TIP NO VENT (SUCTIONS) ×2 IMPLANT

## 2012-11-17 NOTE — Brief Op Note (Signed)
11/17/2012  9:00 AM  PATIENT:  Samantha Mckay  47 y.o. female  PRE-OPERATIVE DIAGNOSIS:  HISTORY OF MALIGNANT NEOPLASM OF LEFT BREAST, ACQUIRED ABSENCE OF BILATERAL OF BREAST/NIPPLES  POST-OPERATIVE DIAGNOSIS:  HISTORY OF MALIGNANT NEOPLASM OF LEFT BREAST,ACQUIRED ABSENCE OF BREAST/NIPPLES  PROCEDURE:  Procedure(s): BILATERAL REMOVAL OF TISSUE EXPANDER/PLACEMENT OF IMPLANTS (Bilateral)  SURGEON:  Surgeon(s) and Role:    * Claire Sanger, DO - Primary  PHYSICIAN ASSISTANT: Shawn Rayburn, PA  ASSISTANTS: none   ANESTHESIA:   general  EBL:  Total I/O In: 1000 [I.V.:1000] Out: -   BLOOD ADMINISTERED:none  DRAINS: none   LOCAL MEDICATIONS USED:  NONE  SPECIMEN:  Source of Specimen:  mastectomy scars  DISPOSITION OF SPECIMEN:  PATHOLOGY  COUNTS:  YES  TOURNIQUET:  * No tourniquets in log *  DICTATION: .Dragon Dictation  PLAN OF CARE: Discharge to home after PACU  PATIENT DISPOSITION:  PACU - hemodynamically stable.   Delay start of Pharmacological VTE agent (>24hrs) due to surgical blood loss or risk of bleeding: no

## 2012-11-17 NOTE — Anesthesia Postprocedure Evaluation (Signed)
  Anesthesia Post-op Note  Patient: Samantha Mckay  Procedure(s) Performed: Procedure(s): BILATERAL REMOVAL OF TISSUE EXPANDER/PLACEMENT OF IMPLANTS (Bilateral)  Patient Location: PACU  Anesthesia Type:General  Level of Consciousness: awake, alert  and oriented  Airway and Oxygen Therapy: Patient Spontanous Breathing  Post-op Pain: mild  Post-op Assessment: Post-op Vital signs reviewed  Post-op Vital Signs: Reviewed  Complications: No apparent anesthesia complications

## 2012-11-17 NOTE — H&P (View-Only) (Signed)
This document contains confidential information from a [facility] medical record system and may be unauthenticated. Release may be made only with a valid authorization or in accordance with applicable policies of Medical Center or its affiliates. This document must be maintained in a secure manner or discarded/destroyed as required by Medical Center policy or by a confidential means such as shredding.     Samantha Mckay [facility]  11/11/2012 10:45 AM   Office Visit  MRN:  [id number]  Department:  Plastic Surgery  Dept Phone: [phone]  Description: Female DOB: [birth year]  Provider: Wayland Denis, DO    Diagnoses -  Acquired absence of bilateral breasts and nipples    -  Primary   V45.71        Dx: Acquired absence of breast and nipple [V45.71]     Vitals - Last Recorded      116/66  76  1.549 m (5' 0.98")  71.668 kg (158 lb)  29.87 kg/m2        Subjective:    Patient ID: Samantha Mckay is a 47 y.o. female.  HPI The patient is a 47 y.o. bf here for a history and physical for secondary breast reconstruction following bilateral breast reconstruction with first stage immediate bilateral breast reconstruction with expander and Alloderm. History:  She was diagnosed with left breast DCIS. She is seeing Dr. Corliss Skains. A screening mammogram detected a suspicious lesion. 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 She is currently 5 feet 1 inch and 154 pounds. She was a 34 B but would like to be a D size but understands we will aim for a B or C cup size.  Her current volume is 400/350 cc Mentor. She was happy around 370cc.   The following portions of the patient's history were reviewed and updated as appropriate: allergies, current medications, past family history, past medical history, past social history, past surgical history and problem list.  Review of Systems  Constitutional: Negative.   HENT: Negative.   Eyes: Negative.    Respiratory: Negative.   Cardiovascular: Negative.   Gastrointestinal: Negative.   Endocrine: Negative.   Genitourinary: Negative.   Allergic/Immunologic: Negative.   Neurological: Negative.   Hematological: Negative.   Psychiatric/Behavioral: Negative.       Objective:   Physical Exam  Constitutional: She is oriented to person, place, and time. She appears well-developed and well-nourished. No distress.  HENT:   Head: Normocephalic and atraumatic.   Nose: Nose normal.   Mouth/Throat: Oropharynx is clear and moist.  Eyes: Conjunctivae and EOM are normal. Pupils are equal, round, and reactive to light.  Neck: Normal range of motion. Neck supple. No JVD present. No tracheal deviation present. No thyromegaly present.  Cardiovascular: Normal rate, regular rhythm, normal heart sounds and intact distal pulses.  Exam reveals no gallop and no friction rub.    No murmur heard. Pulmonary/Chest: Effort normal and breath sounds normal. No stridor. No respiratory distress. She has no wheezes. She has no rales. She exhibits no tenderness.  Abdominal: Soft. Bowel sounds are normal. She exhibits no distension and no mass. There is no tenderness.  Musculoskeletal: Normal range of motion.  Lymphadenopathy:    She has no cervical adenopathy.  Neurological: She is alert and oriented to person, place, and time. No cranial nerve deficit. Coordination normal.  Skin: Skin is warm and dry.  Psychiatric: She has a normal mood and affect. Her behavior is normal. Judgment and thought content normal.  Assessment:   1.  Acquired absence of bilateral breasts and nipples     Left Breast cancer     Plan:   The procedure, possible risks, benefits and complications were discussed with the patient. Plan for removal of expanders and placement of bilateral breast implants.   Medications Ordered This Encounter      HYDROcodone-acetaminophen (NORCO) 5-325 mg per tablet Take 1 tablet by mouth every 6 (six) hours  as needed for 10 days for Pain.    promethazine (PHENERGAN) 12.5 MG tablet Take 1 tablet (12.5 mg total) by mouth every 6 (six) hours as needed for 7 days for Nausea.    diazepam (VALIUM) 2 MG tablet Take 1 tablet (2 mg total) by mouth every 6 (six) hours as needed for 10 days for Anxiety.    cephalexin (KEFLEX) 500 MG capsule Take 1 capsule (500 mg total) by mouth 4 times daily.

## 2012-11-17 NOTE — Anesthesia Preprocedure Evaluation (Signed)
Anesthesia Evaluation  Patient identified by MRN, date of birth, ID band Patient awake    Reviewed: Allergy & Precautions, H&P , NPO status , Patient's Chart, lab work & pertinent test results  Airway Mallampati: I TM Distance: >3 FB Neck ROM: Full    Dental  (+) Teeth Intact and Dental Advisory Given   Pulmonary  breath sounds clear to auscultation        Cardiovascular Rhythm:Regular Rate:Normal     Neuro/Psych    GI/Hepatic   Endo/Other    Renal/GU      Musculoskeletal   Abdominal   Peds  Hematology   Anesthesia Other Findings   Reproductive/Obstetrics                           Anesthesia Physical Anesthesia Plan  ASA: II  Anesthesia Plan: General   Post-op Pain Management:    Induction: Intravenous  Airway Management Planned: Oral ETT  Additional Equipment:   Intra-op Plan:   Post-operative Plan: Extubation in OR  Informed Consent: I have reviewed the patients History and Physical, chart, labs and discussed the procedure including the risks, benefits and alternatives for the proposed anesthesia with the patient or authorized representative who has indicated his/her understanding and acceptance.   Dental advisory given  Plan Discussed with: CRNA, Anesthesiologist and Surgeon  Anesthesia Plan Comments:         Anesthesia Quick Evaluation  

## 2012-11-17 NOTE — Transfer of Care (Signed)
Immediate Anesthesia Transfer of Care Note  Patient: Samantha Mckay  Procedure(s) Performed: Procedure(s): BILATERAL REMOVAL OF TISSUE EXPANDER/PLACEMENT OF IMPLANTS (Bilateral)  Patient Location: PACU  Anesthesia Type:General  Level of Consciousness: awake  Airway & Oxygen Therapy: Patient Spontanous Breathing and Patient connected to face mask oxygen  Post-op Assessment: Report given to PACU RN and Post -op Vital signs reviewed and stable  Post vital signs: Reviewed and stable  Complications: No apparent anesthesia complications

## 2012-11-17 NOTE — H&P (Addendum)
This document contains confidential information from a [facility] medical record system and may be unauthenticated. Release may be made only with a valid authorization or in accordance with applicable policies of Medical Center or its affiliates. This document must be maintained in a secure manner or discarded/destroyed as required by Medical Center policy or by a confidential means such as shredding.     Samantha Mckay  11/11/2012 10:45 AM   Office Visit  MRN:  [id number]  Department:  Plastic Surgery  Dept Phone: [phone]  Description: Female DOB: 02/05/1966  Provider: Claire Sanger, DO    Diagnoses -  Acquired absence of bilateral breasts and nipples    -  Primary   V45.71        Dx: Acquired absence of breast and nipple [V45.71]     Vitals - Last Recorded      116/66  76  1.549 m (5' 0.98")  71.668 kg (158 lb)  29.87 kg/m2        Subjective:    Patient ID: Samantha Mckay is a 46 y.o. female.  HPI The patient is a 46 y.o. bf here for a history and physical for secondary breast reconstruction following bilateral breast reconstruction with first stage immediate bilateral breast reconstruction with expander and Alloderm. History:  She was diagnosed with left breast DCIS. She is seeing Dr. Tsuei. A screening mammogram detected a suspicious lesion. 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 She is currently 5 feet 1 inch and 154 pounds. She was a 34 B but would like to be a D size but understands we will aim for a B or C cup size.  Her current volume is 400/350 cc Mentor. She was happy around 370cc.   The following portions of the patient's history were reviewed and updated as appropriate: allergies, current medications, past family history, past medical history, past social history, past surgical history and problem list.  Review of Systems  Constitutional: Negative.   HENT: Negative.   Eyes: Negative.    Respiratory: Negative.   Cardiovascular: Negative.   Gastrointestinal: Negative.   Endocrine: Negative.   Genitourinary: Negative.   Allergic/Immunologic: Negative.   Neurological: Negative.   Hematological: Negative.   Psychiatric/Behavioral: Negative.       Objective:   Physical Exam  Constitutional: She is oriented to person, place, and time. She appears well-developed and well-nourished. No distress.  HENT:   Head: Normocephalic and atraumatic.   Nose: Nose normal.   Mouth/Throat: Oropharynx is clear and moist.  Eyes: Conjunctivae and EOM are normal. Pupils are equal, round, and reactive to light.  Neck: Normal range of motion. Neck supple. No JVD present. No tracheal deviation present. No thyromegaly present.  Cardiovascular: Normal rate, regular rhythm, normal heart sounds and intact distal pulses.  Exam reveals no gallop and no friction rub.    No murmur heard. Pulmonary/Chest: Effort normal and breath sounds normal. No stridor. No respiratory distress. She has no wheezes. She has no rales. She exhibits no tenderness.  Abdominal: Soft. Bowel sounds are normal. She exhibits no distension and no mass. There is no tenderness.  Musculoskeletal: Normal range of motion.  Lymphadenopathy:    She has no cervical adenopathy.  Neurological: She is alert and oriented to person, place, and time. No cranial nerve deficit. Coordination normal.  Skin: Skin is warm and dry.  Psychiatric: She has a normal mood and affect. Her behavior is normal. Judgment and thought content normal.        Assessment:   1.  Acquired absence of bilateral breasts and nipples     Left Breast cancer     Plan:   The procedure, possible risks, benefits and complications were discussed with the patient. Plan for removal of expanders and placement of bilateral breast implants.   Medications Ordered This Encounter      HYDROcodone-acetaminophen (NORCO) 5-325 mg per tablet Take 1 tablet by mouth every 6 (six) hours  as needed for 10 days for Pain.    promethazine (PHENERGAN) 12.5 MG tablet Take 1 tablet (12.5 mg total) by mouth every 6 (six) hours as needed for 7 days for Nausea.    diazepam (VALIUM) 2 MG tablet Take 1 tablet (2 mg total) by mouth every 6 (six) hours as needed for 10 days for Anxiety.    cephalexin (KEFLEX) 500 MG capsule Take 1 capsule (500 mg total) by mouth 4 times daily.      

## 2012-11-17 NOTE — Interval H&P Note (Signed)
History and Physical Interval Note:  11/17/2012 7:12 AM  Samantha Mckay  has presented today for surgery, with the diagnosis of HISTORY OF MALIGNANT NEOPLASM OF LEFT BREAST, ACQUIRED ABSENCE OF BILATERAL OF BREAST/NIPPLES  The various methods of treatment have been discussed with the patient and family. After consideration of risks, benefits and other options for treatment, the patient has consented to  Procedure(s): BILATERAL REMOVAL OF TISSUE EXPANDER/PLACEMENT OF IMPLANTS (Bilateral) as a surgical intervention .  The patient's history has been reviewed, patient examined, no change in status, stable for surgery.  I have reviewed the patient's chart and labs.  Questions were answered to the patient's satisfaction.     SANGER,Shanikka Wonders

## 2012-11-17 NOTE — Anesthesia Procedure Notes (Signed)
Procedure Name: Intubation Date/Time: 11/17/2012 7:37 AM Performed by: Zenia Resides D Pre-anesthesia Checklist: Patient identified, Emergency Drugs available, Suction available and Patient being monitored Patient Re-evaluated:Patient Re-evaluated prior to inductionOxygen Delivery Method: Circle System Utilized Preoxygenation: Pre-oxygenation with 100% oxygen Intubation Type: IV induction Ventilation: Mask ventilation without difficulty Laryngoscope Size: Mac and 3 Grade View: Grade I Tube type: Oral Tube size: 7.0 mm Number of attempts: 1 Airway Equipment and Method: stylet and oral airway Placement Confirmation: ETT inserted through vocal cords under direct vision,  positive ETCO2 and breath sounds checked- equal and bilateral Secured at: 22 cm Tube secured with: Tape Dental Injury: Teeth and Oropharynx as per pre-operative assessment

## 2012-11-17 NOTE — Op Note (Signed)
Op report Bilateral Exchange   SURGICAL DIVISION: Plastic Surgery  PREOPERATIVE DIAGNOSES:  1. History of breast cancer.  2. Acquired absence of bilateral breast.   POSTOPERATIVE DIAGNOSES:  1. History of breast cancer.  2. Acquired absence of bilateral breast.   PROCEDURE:  1. Bilateral exchange of tissue expanders for implants.  2. Bilateral capsulotomies for implant respositioning.  SURGEON: Wayland Denis, DO  ASSISTANT:  Shawn Rayburn, PA  ANESTHESIA:  General.   COMPLICATIONS: None.   IMPLANTS: Left - Mentor Smooth Round Ultra High Profile Gel 400cc. Ref #[id number].  Serial Number [id number] Right - Mentor Smooth Round Ultra High Profile Gel 400cc. Ref #[id number].  Serial Number [id number]  INDICATIONS FOR PROCEDURE:  The patient has a history of bilateral mastectomies and had tissue expanders placed at the time of mastectomies. She now presents for exchange of her expanders for implants.  She requires capsulotomies to better position the implants.   CONSENT:  Informed consent was obtained directly from the patient. Risks, benefits and alternatives were fully discussed. Specific risks including but not limited to bleeding, infection, hematoma, seroma, scarring, pain, implant infection, implant extrusion, capsular contracture, asymmetry, wound healing problems, and need for further surgery were all discussed. The patient did have an ample opportunity to have her questions answered to her satisfaction.   DESCRIPTION OF PROCEDURE:  The patient was taken to the operating room. SCDs were placed and IV antibiotics were given. The patient's chest was prepped and draped in a sterile fashion. A time out was performed and the implants to be used were identified.  One percent Xylocaine with epinephrine was used to infiltrate the area.   The old mastectomy scar was opened and the sliver of skin sent to pathology. The pectoralis was split to expose the tissue expander which was removed.  Inspection of the pocket showed a normal healthy capsule and good integration of the biologic matrix.   Circumferential capsulotomies were performed on each breast to allow for breast pocket expansion on either side.  Measurements were made on either side to confirm adequate pocket size for the implant dimensions.  Hemostasis was ensured.  Gloves were changed. The 430 cc sizer was placed in the pocket and noted to be too large.  The 400 cc implant was therefore selected.  The implants were placed in the pockets and oriented appropriately. The pectoralis major muscle and capsule on the anterior surface were re-closed with a 3-0 Vicryl suture. The remaining skin was closed with 4-0 vicryl followed by 5-0 monocryl subcuticular stitches. Dermabond and a sterile dressings were applied.  A breast binder was placed. The patient was allowed to wake from anesthesia and taken to the recovery room in satisfactory condition.

## 2012-11-18 ENCOUNTER — Encounter (HOSPITAL_BASED_OUTPATIENT_CLINIC_OR_DEPARTMENT_OTHER): Payer: Self-pay | Admitting: Plastic Surgery

## 2012-12-05 ENCOUNTER — Telehealth: Payer: Self-pay | Admitting: *Deleted

## 2012-12-05 NOTE — Telephone Encounter (Signed)
Left message for a return phone call to reschedule her appt.  Awaiting patient response I have cancelled her appts.

## 2012-12-15 ENCOUNTER — Telehealth: Payer: Self-pay | Admitting: *Deleted

## 2012-12-15 NOTE — Telephone Encounter (Signed)
Left 2nd message for a return phone call to schedule an appt. With a new appt. Awaiting patient response.

## 2013-01-20 ENCOUNTER — Ambulatory Visit: Payer: Self-pay | Admitting: Oncology

## 2013-01-20 ENCOUNTER — Telehealth: Payer: Self-pay | Admitting: Oncology

## 2013-01-20 ENCOUNTER — Other Ambulatory Visit: Payer: Self-pay | Admitting: Lab

## 2013-01-20 NOTE — Telephone Encounter (Signed)
, °

## 2013-03-08 ENCOUNTER — Other Ambulatory Visit: Payer: Self-pay | Admitting: Oncology

## 2013-03-08 DIAGNOSIS — C50919 Malignant neoplasm of unspecified site of unspecified female breast: Secondary | ICD-10-CM

## 2013-03-09 ENCOUNTER — Other Ambulatory Visit: Payer: Self-pay | Admitting: *Deleted

## 2013-03-09 DIAGNOSIS — C50919 Malignant neoplasm of unspecified site of unspecified female breast: Secondary | ICD-10-CM

## 2013-03-09 DIAGNOSIS — D0512 Intraductal carcinoma in situ of left breast: Secondary | ICD-10-CM

## 2013-03-10 ENCOUNTER — Ambulatory Visit (HOSPITAL_BASED_OUTPATIENT_CLINIC_OR_DEPARTMENT_OTHER): Payer: Managed Care, Other (non HMO) | Admitting: Oncology

## 2013-03-10 ENCOUNTER — Telehealth: Payer: Self-pay | Admitting: Oncology

## 2013-03-10 ENCOUNTER — Encounter: Payer: Self-pay | Admitting: Oncology

## 2013-03-10 ENCOUNTER — Other Ambulatory Visit (HOSPITAL_BASED_OUTPATIENT_CLINIC_OR_DEPARTMENT_OTHER): Payer: Managed Care, Other (non HMO) | Admitting: Lab

## 2013-03-10 VITALS — BP 99/67 | HR 77 | Temp 98.6°F | Resp 20 | Ht 61.0 in | Wt 160.7 lb

## 2013-03-10 DIAGNOSIS — Z9010 Acquired absence of unspecified breast and nipple: Secondary | ICD-10-CM

## 2013-03-10 DIAGNOSIS — D0590 Unspecified type of carcinoma in situ of unspecified breast: Secondary | ICD-10-CM

## 2013-03-10 DIAGNOSIS — D0512 Intraductal carcinoma in situ of left breast: Secondary | ICD-10-CM

## 2013-03-10 DIAGNOSIS — Z1501 Genetic susceptibility to malignant neoplasm of breast: Secondary | ICD-10-CM

## 2013-03-10 DIAGNOSIS — C50919 Malignant neoplasm of unspecified site of unspecified female breast: Secondary | ICD-10-CM

## 2013-03-10 LAB — CBC WITH DIFFERENTIAL/PLATELET
Basophils Absolute: 0 10*3/uL (ref 0.0–0.1)
EOS%: 2.4 % (ref 0.0–7.0)
Eosinophils Absolute: 0.1 10*3/uL (ref 0.0–0.5)
HGB: 12.5 g/dL (ref 11.6–15.9)
LYMPH%: 51.9 % — ABNORMAL HIGH (ref 14.0–49.7)
MCH: 26.5 pg (ref 25.1–34.0)
MCV: 82.1 fL (ref 79.5–101.0)
MONO%: 9 % (ref 0.0–14.0)
NEUT#: 1.4 10*3/uL — ABNORMAL LOW (ref 1.5–6.5)
NEUT%: 36.3 % — ABNORMAL LOW (ref 38.4–76.8)
Platelets: 181 10*3/uL (ref 145–400)

## 2013-03-10 LAB — COMPREHENSIVE METABOLIC PANEL (CC13)
AST: 23 U/L (ref 5–34)
Albumin: 3.5 g/dL (ref 3.5–5.0)
Alkaline Phosphatase: 101 U/L (ref 40–150)
BUN: 10.1 mg/dL (ref 7.0–26.0)
Creatinine: 1 mg/dL (ref 0.6–1.1)
Glucose: 92 mg/dl (ref 70–140)
Total Bilirubin: 0.43 mg/dL (ref 0.20–1.20)

## 2013-03-27 NOTE — Progress Notes (Signed)
OFFICE PROGRESS NOTE  CC**  MCNAMARA,MICHAEL T, MD [address] [place] [address]  DIAGNOSIS: 47 year old female with DCIS of the left breast with microcalcifications diagnosed December 2012  PRIOR THERAPY:  #1 patient underwent bilateral simple mastectomies with sentinel lymph node biopsies on 09/21/2011. 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 The right simple mastectomy with sentinel lymph node biopsy showed no evidence of malignancy.  #2 patient underwent bilateral breast reconstruction on the care of Dr. Wayland Denis.  #3 patient also had bilateral salpingo-oophorectomy on 02/15/2012.  #3 patient had genetic testing performed and she was BRCA 2 positive.  #4 patient has been on Arimidex 1 mg daily  CURRENT THERAPY:Arimidex 1 mg daily since March 2013  INTERVAL HISTORY: Samantha Mckay 47 y.o. female returns for followup visit. She has been seen by Dr. Pierce Crane who had been following her every 6 months. She is on Arimidex tolerating it well without any significant problems. She denies any fevers chills night sweats headaches shortness of breath chest pains palpitations no myalgias and arthralgias. Remainder of the 10 point review of systems is negative.  MEDICAL HISTORY: Past Medical History  Diagnosis Date  . Liver mass      numerous simple cysts throughout hepatic parenbhyma,one 10.7cm  . Breast cancer 09/22/11      right and left masectomy wirth expanders  . Breast cancer 09/21/11 [facility]    Left breast  masectomy-invasive ductal ca  . Breast cancer     ER/PR positive  . Cancer     breast    ALLERGIES:  has No Known Allergies.  MEDICATIONS:  Current Outpatient Prescriptions  Medication Sig Dispense Refill  . anastrozole (ARIMIDEX) 1 MG tablet TAKE 1 TABLET BY MOUTH ONCE DAILY  30 tablet  0  . calcium carbonate (OS-CAL) 600 MG TABS Take 600 mg by mouth 2 (two) times daily  with a meal.      . cholecalciferol (VITAMIN D) 1000 UNITS tablet Take 1,000 Units by mouth 2 (two) times daily.      Marland Kitchen ibuprofen (ADVIL,MOTRIN) 200 MG tablet Take 200 mg by mouth every 6 (six) hours as needed. For pain      . Multiple Vitamin (MULTIVITAMIN) tablet Take 1 tablet by mouth daily.       No current facility-administered medications for this visit.    SURGICAL HISTORY:  Past Surgical History  Procedure Laterality Date  . Mastectomy w/ sentinel node biopsy  09/21/2011    Procedure: MASTECTOMY WITH SENTINEL LYMPH NODE BIOPSY;  Surgeon: Wilmon Arms. Corliss Skains, MD;  Location: [facility] OR;  Service: General;  Laterality: Bilateral;  Bilateral total mastectomies with left axillary sentinel lymph node biopsy.  Marland Kitchen Axillary lymph node dissection  09/21/2011    Procedure: AXILLARY LYMPH NODE DISSECTION;  Surgeon: Wilmon Arms. Corliss Skains, MD;  Location: [facility] OR;  Service: General;  Laterality: Left;  . Tissue expander placement  09/21/2011    Procedure: TISSUE EXPANDER;  Surgeon: Wayland Denis, DO;  Location: [facility] OR;  Service: Plastics;  Laterality: Bilateral;  Bilateral breast reconstruction with placement of bilateral tissue expanders.  . Intrauterine device insertion    . Laparoscopy  02/15/2012    Procedure: LAPAROSCOPY OPERATIVE;  Surgeon: Zelphia Cairo, MD;  Location: [facility];  Service: Gynecology;  Laterality: N/A;  . Salpingoophorectomy  02/15/2012    Procedure: SALPINGO OOPHERECTOMY;  Surgeon: Zelphia Cairo, MD;  Location: [facility];  Service: Gynecology;  Laterality: Bilateral;  .  Iud removal  02/15/2012    Procedure: INTRAUTERINE DEVICE (IUD) REMOVAL;  Surgeon: Zelphia Cairo, MD;  Location: [facility];  Service: Gynecology;  Laterality: N/A;  . Breast implant exchange Bilateral 11/17/2012    Procedure: BILATERAL REMOVAL OF TISSUE EXPANDER/PLACEMENT OF IMPLANTS;  Surgeon: Wayland Denis, DO;  Location: [facility];  Service: Plastics;  Laterality: Bilateral;    REVIEW OF SYSTEMS:  Pertinent items  are noted in HPI.   HEALTH MAINTENANCE:   PHYSICAL EXAMINATION: Blood pressure 99/67, pulse 77, temperature 98.6 F (37 C), temperature source Oral, resp. rate 20, height 5\' 1"  (1.549 m), weight 160 lb 11.2 oz (72.893 kg). Body mass index is 30.38 kg/(m^2). ECOG PERFORMANCE STATUS: 0 - Asymptomatic   General appearance: alert, cooperative and appears stated age Lymph nodes: Cervical, supraclavicular, and axillary nodes normal. Resp: clear to auscultation bilaterally Cardio: regular rate and rhythm GI: soft, non-tender; bowel sounds normal; no masses,  no organomegaly Extremities: extremities normal, atraumatic, no cyanosis or edema Neurologic: Grossly normal   LABORATORY DATA: Lab Results  Component Value Date   WBC 3.8* 03/10/2013   HGB 12.5 03/10/2013   HCT 38.9 03/10/2013   MCV 82.1 03/10/2013   PLT 181 03/10/2013      Chemistry      Component Value Date/Time   NA 140 03/10/2013 1152   NA 141 03/18/2012 1108   K 4.6 03/10/2013 1152   K 4.7 03/18/2012 1108   CL 107 06/21/2012 1536   CL 106 03/18/2012 1108   CO2 26 03/10/2013 1152   CO2 27 03/18/2012 1108   BUN 10.1 03/10/2013 1152   BUN 14 03/18/2012 1108   CREATININE 1.0 03/10/2013 1152   CREATININE 0.89 03/18/2012 1108      Component Value Date/Time   CALCIUM 9.6 03/10/2013 1152   CALCIUM 9.2 03/18/2012 1108   ALKPHOS 101 03/10/2013 1152   ALKPHOS 65 03/18/2012 1108   AST 23 03/10/2013 1152   AST 25 03/18/2012 1108   ALT 17 03/10/2013 1152   ALT 27 03/18/2012 1108   BILITOT 0.43 03/10/2013 1152   BILITOT 0.5 03/18/2012 1108       RADIOGRAPHIC STUDIES:  No results found.  ASSESSMENT: 47 year old female with  #1 history of large volume DCIS of the left breast with associated microinvasive disease. Patient is status post bilateral simple mastectomy sentinel lymph node biopsies. Performed 09/21/2011. On the left she was noted to have one of 17 lymph nodes positive for metastatic ductal carcinoma. Right simple mastectomy  revealed no evidence of lymph node positivity 2 lymph nodes were negative.  #2 patient has been receiving Arimidex 1 mg daily total of 5-10 years of therapy is planned.  #3 patient also is status post bilateral salpingo-oophorectomy in 02/15/2012.  #4 patient is also BRCA 2 positive   PLAN:   #1 continue Arimidex 1 mg daily.  #2 she will return in 6 months time for followup.   All questions were answered. The patient knows to call the clinic with any problems, questions or concerns. We can certainly see the patient much sooner if necessary.  I spent 25 minutes counseling the patient face to face. The total time spent in the appointment was 30 minutes.    Drue Second, MD Medical/Oncology [facility] [phone] (beeper) [phone] (Office)

## 2013-04-07 ENCOUNTER — Other Ambulatory Visit: Payer: Self-pay | Admitting: *Deleted

## 2013-04-07 DIAGNOSIS — C50919 Malignant neoplasm of unspecified site of unspecified female breast: Secondary | ICD-10-CM

## 2013-04-07 MED ORDER — ANASTROZOLE 1 MG PO TABS: 1.0000 mg | ORAL_TABLET | Freq: Every day | ORAL | Status: DC

## 2013-05-30 ENCOUNTER — Encounter (INDEPENDENT_AMBULATORY_CARE_PROVIDER_SITE_OTHER): Payer: Self-pay | Admitting: Surgery

## 2013-05-30 ENCOUNTER — Encounter (INDEPENDENT_AMBULATORY_CARE_PROVIDER_SITE_OTHER): Payer: Self-pay

## 2013-05-30 ENCOUNTER — Ambulatory Visit (INDEPENDENT_AMBULATORY_CARE_PROVIDER_SITE_OTHER): Payer: Private Health Insurance - Indemnity | Admitting: Surgery

## 2013-05-30 VITALS — BP 126/70 | HR 72 | Resp 16 | Ht 61.0 in | Wt 163.0 lb

## 2013-05-30 DIAGNOSIS — D0590 Unspecified type of carcinoma in situ of unspecified breast: Secondary | ICD-10-CM

## 2013-05-30 DIAGNOSIS — D0512 Intraductal carcinoma in situ of left breast: Secondary | ICD-10-CM

## 2013-05-30 NOTE — Progress Notes (Signed)
This is a 18 Year oldpatient with a history of left breast cancer with Large volume DCIS of the left breast with microinvasive disease of the left axilla. She is currently on Arimidex and is tolerating that well. She underwent bilateral mastectomies and left axillary sentinel lymph node biopsy in February of 2013. She had immediate reconstruction. She has been followed up with every 2 year MRI. She is doing quite well.  Both mastectomy sites are healing well. Her implants seemed to be free of any sign of contracture. No palpable masses around either breast implant. No axillary lymphadenopathy.  The patient may continue with annual followup visits with me. Continue arimidex With Dr. Welton Flakes.  Followup in 1 year.  Wilmon Arms. Corliss Skains, MD, [facility]  General/ Trauma Surgery  05/30/2013 11:55 AM

## 2013-06-09 ENCOUNTER — Encounter: Payer: Self-pay | Admitting: Oncology

## 2013-06-09 ENCOUNTER — Encounter: Payer: Self-pay | Admitting: *Deleted

## 2013-06-09 NOTE — Progress Notes (Signed)
[place], [id number], approved anastrozole from 06/09/13-06/09/14.

## 2013-06-09 NOTE — Progress Notes (Signed)
RECEIVED A FAX FROM [facility] CONCERNING A PRIOR AUTHORIZATION FOR ANASTROZOLE. THIS REQUEST WAS PLACED IN THE MANAGED CARE BIN

## 2013-09-08 ENCOUNTER — Telehealth: Payer: Self-pay | Admitting: *Deleted

## 2013-09-08 ENCOUNTER — Encounter (INDEPENDENT_AMBULATORY_CARE_PROVIDER_SITE_OTHER): Payer: Self-pay

## 2013-09-08 ENCOUNTER — Ambulatory Visit (HOSPITAL_BASED_OUTPATIENT_CLINIC_OR_DEPARTMENT_OTHER): Payer: 59 | Admitting: Oncology

## 2013-09-08 ENCOUNTER — Other Ambulatory Visit (HOSPITAL_BASED_OUTPATIENT_CLINIC_OR_DEPARTMENT_OTHER): Payer: 59

## 2013-09-08 ENCOUNTER — Encounter: Payer: Self-pay | Admitting: Oncology

## 2013-09-08 VITALS — BP 116/70 | HR 70 | Temp 98.5°F | Resp 18 | Ht 61.0 in | Wt 165.4 lb

## 2013-09-08 DIAGNOSIS — D0510 Intraductal carcinoma in situ of unspecified breast: Secondary | ICD-10-CM

## 2013-09-08 DIAGNOSIS — M8580 Other specified disorders of bone density and structure, unspecified site: Secondary | ICD-10-CM

## 2013-09-08 DIAGNOSIS — E559 Vitamin D deficiency, unspecified: Secondary | ICD-10-CM

## 2013-09-08 DIAGNOSIS — Z1509 Genetic susceptibility to other malignant neoplasm: Secondary | ICD-10-CM

## 2013-09-08 DIAGNOSIS — D0590 Unspecified type of carcinoma in situ of unspecified breast: Secondary | ICD-10-CM

## 2013-09-08 DIAGNOSIS — C50919 Malignant neoplasm of unspecified site of unspecified female breast: Secondary | ICD-10-CM

## 2013-09-08 DIAGNOSIS — Z1501 Genetic susceptibility to malignant neoplasm of breast: Secondary | ICD-10-CM

## 2013-09-08 DIAGNOSIS — D0512 Intraductal carcinoma in situ of left breast: Secondary | ICD-10-CM

## 2013-09-08 LAB — CBC WITH DIFFERENTIAL/PLATELET
BASO%: 0.4 % (ref 0.0–2.0)
Basophils Absolute: 0 10*3/uL (ref 0.0–0.1)
EOS%: 1.6 % (ref 0.0–7.0)
Eosinophils Absolute: 0.1 10*3/uL (ref 0.0–0.5)
HCT: 38.7 % (ref 34.8–46.6)
HGB: 12.3 g/dL (ref 11.6–15.9)
LYMPH#: 2.4 10*3/uL (ref 0.9–3.3)
LYMPH%: 54.3 % — AB (ref 14.0–49.7)
MCH: 26.2 pg (ref 25.1–34.0)
MCHC: 31.7 g/dL (ref 31.5–36.0)
MCV: 82.8 fL (ref 79.5–101.0)
MONO#: 0.4 10*3/uL (ref 0.1–0.9)
MONO%: 8.5 % (ref 0.0–14.0)
NEUT#: 1.6 10*3/uL (ref 1.5–6.5)
NEUT%: 35.2 % — ABNORMAL LOW (ref 38.4–76.8)
Platelets: 187 10*3/uL (ref 145–400)
RBC: 4.68 10*6/uL (ref 3.70–5.45)
RDW: 13.2 % (ref 11.2–14.5)
WBC: 4.5 10*3/uL (ref 3.9–10.3)

## 2013-09-08 LAB — COMPREHENSIVE METABOLIC PANEL (CC13)
ALT: 18 U/L (ref 0–55)
ANION GAP: 9 meq/L (ref 3–11)
AST: 21 U/L (ref 5–34)
Albumin: 3.8 g/dL (ref 3.5–5.0)
Alkaline Phosphatase: 108 U/L (ref 40–150)
BILIRUBIN TOTAL: 0.32 mg/dL (ref 0.20–1.20)
BUN: 11.9 mg/dL (ref 7.0–26.0)
CHLORIDE: 106 meq/L (ref 98–109)
CO2: 26 meq/L (ref 22–29)
Calcium: 9.5 mg/dL (ref 8.4–10.4)
Creatinine: 0.9 mg/dL (ref 0.6–1.1)
Glucose: 128 mg/dl (ref 70–140)
Potassium: 4.2 mEq/L (ref 3.5–5.1)
SODIUM: 141 meq/L (ref 136–145)
TOTAL PROTEIN: 7.4 g/dL (ref 6.4–8.3)

## 2013-09-08 MED ORDER — ANASTROZOLE 1 MG PO TABS: 1.0000 mg | ORAL_TABLET | Freq: Every day | ORAL | Status: DC

## 2013-09-08 NOTE — Patient Instructions (Signed)
BRCA-1 and BRCA-2 BRCA-1 and BRCA-2 are 2 genes that are linked with hereditary breast and ovarian cancers. About 200,000 women are diagnosed with invasive breast cancer each year and about 23,000 with ovarian cancer (according to the American Cancer Society). Of these cancers, about 5% to 10% will be due to a mutation in one of the BRCA genes. Men can also inherit an increased risk of developing breast cancer, primarily from an alteration in the BRCA-2 gene.  Individuals with mutations in BRCA1 or BRCA2 have significantly elevated risks for breast cancer (up to 80% lifetime risk), ovarian cancer (up to 40% lifetime risk), bilateral breast cancer and other types of cancers. BRCA mutations are inherited and passed from generation to generation. One half of the time, they are passed from the father's side of the family.  The DNA in white blood cells is used to detect mutations in the BRCA genes. While the gene products (proteins) of the BRCA genes act only in breast and ovarian tissue, the genes are present in every cell of the body and blood is the most easily accessible source of that DNA. PREPARATION FOR TEST The test for BRCA mutations is done on a blood sample collected by needle from a vein in the arm. The test does not require surgical biopsy of breast or ovarian tissue.  NORMAL FINDINGS No genetic mutations. Ranges for normal findings may vary among different laboratories and hospitals. You should always check with your doctor after having lab work or other tests done to discuss the meaning of your test results and whether your values are considered within normal limits. MEANING OF TEST  Your caregiver will go over the test results with you and discuss the importance and meaning of your results, as well as treatment options and the need for additional tests if necessary. OBTAINING THE TEST RESULTS It is your responsibility to obtain your test results. Ask the lab or department performing the test  when and how you will get your results. OTHER THINGS TO KNOW Your test results may have implications for other family members. When one member of a family is tested for BRCA mutations, issues often arise about how or whether to share this information with other family members. Seek advice from a genetic counselor about communication of result with your family members.  Pre and post test consultation with a health care provider knowledgeable about genetic testing cannot be overemphasized.  There are many issues to be considered when preparing for a genetic test and upon learning the results, and a genetic counselor has the knowledge and experience to help you sort through them.  If the BRCA test is positive, the options include increased frequency of check-ups (e.g., mammography, blood tests for CA-125, or transvaginal ultrasonography); medications that could reduce risk (e.g., oral contraceptives or tamoxifen); or surgical removal of the ovaries or breasts. There are a number of variables involved and it is important to discuss your options with your doctor and genetic counselor. Research studies have reported that for every 1000 women negative for BRCA mutations, between 12 and 45 of them will develop breast cancer by age 50 and between 3 and 4 will develop ovarian cancer by age 50. The risk increases with age. The test can be ordered by a doctor, preferably by one who can also offer genetic counseling. The blood sample will be sent to a laboratory that specializes in BRCA testing. The American Society of Clinical Oncology and the National Breast Cancer Coalition encourage women seeking the   test to participate in long-term outcome studies to help gather information on the effectiveness of different check-up and treatment options. Document Released: 08/06/2004 Document Revised: 10/05/2011 Document Reviewed: 06/18/2008 [facility] Patient Information 2014 [place], [place].   Osteoporosis Throughout your  life, your body breaks down old bone and replaces it with new bone. As you get older, your body does not replace bone as quickly as it breaks it down. By the age of 28 years, most people begin to gradually lose bone because of the imbalance between bone loss and replacement. Some people lose more bone than others. Bone loss beyond a specified normal degree is considered osteoporosis.  Osteoporosis affects the strength and durability of your bones. The inside of the ends of your bones and your flat bones, like the bones of your pelvis, look like honeycomb, filled with tiny open spaces. As bone loss occurs, your bones become less dense. This means that the open spaces inside your bones become bigger and the walls between these spaces become thinner. This makes your bones weaker. Bones of a person with osteoporosis can become so weak that they can break (fracture) during minor accidents, such as a simple fall. CAUSES  The following factors have been associated with the development of osteoporosis:  Smoking.  Drinking more than 2 alcoholic drinks several days per week.  Long-term use of certain medicines:  Corticosteroids.  Chemotherapy medicines.  Thyroid medicines.  Antiepileptic medicines.  Gonadal hormone suppression medicine.  Immunosuppression medicine.  Being underweight.  Lack of physical activity.  Lack of exposure to the sun. This can lead to vitamin D deficiency.  Certain medical conditions:  Certain inflammatory bowel diseases, such as Crohn disease and ulcerative colitis.  Diabetes.  Hyperthyroidism.  Hyperparathyroidism. RISK FACTORS Anyone can develop osteoporosis. However, the following factors can increase your risk of developing osteoporosis:  Gender Women are at higher risk than men.  Age Being older than 69 years increases your risk.  Ethnicity White and Asian people have an increased risk.  Weight Being extremely underweight can increase your risk of  osteoporosis.  Family history of osteoporosis Having a family member who has developed osteoporosis can increase your risk. SYMPTOMS  Usually, people with osteoporosis have no symptoms.  DIAGNOSIS  Signs during a physical exam that may prompt your caregiver to suspect osteoporosis include:  Decreased height. This is usually caused by the compression of the bones that form your spine (vertebrae) because they have weakened and become fractured.  A curving or rounding of the upper back (kyphosis). To confirm signs of osteoporosis, your caregiver may request a procedure that uses 2 low-dose X-ray beams with different levels of energy to measure your bone mineral density (dual-energy X-ray absorptiometry [DXA]). Also, your caregiver may check your level of vitamin D. TREATMENT  The goal of osteoporosis treatment is to strengthen bones in order to decrease the risk of bone fractures. There are different types of medicines available to help achieve this goal. Some of these medicines work by slowing the processes of bone loss. Some medicines work by increasing bone density. Treatment also involves making sure that your levels of calcium and vitamin D are adequate. PREVENTION  There are things you can do to help prevent osteoporosis. Adequate intake of calcium and vitamin D can help you achieve optimal bone mineral density. Regular exercise can also help, especially resistance and weight-bearing activities. If you smoke, quitting smoking is an important part of osteoporosis prevention. MAKE SURE YOU:  Understand these instructions.  Will watch  your condition.  Will get help right away if you are not doing well or get worse. FOR MORE INFORMATION www.osteo.org and EquipmentWeekly.com.ee Document Released: 04/22/2005 Document Revised: 11/07/2012 Document Reviewed: 06/27/2011 [facility] Patient Information 2014 [place], [place].  Breast Cancer Survivor Follow-Up Breast cancer begins when cells in the breast divide  too rapidly. The extra cells form a lump (tumor). When the cancer is treated, the goal is to get rid of all cancer cells. However, sometimes a few cells survive. These cancer cells can then grow. They become recurrent cancer. This means the cancer comes back after treatment.  Most cases of recurrent breast cancer develop 3 to 5 years after treatment. However, sometimes it comes back just a few months after treatment. Other times, it does not come back until years later. If the cancer comes back in the same area as the first breast cancer, it is called a local recurrence. If the cancer comes back somewhere else in the body, it is called regional recurrence if the site is fairly near the breast or distant recurrence if it is far from the breast. Your caregiver may also use the term metastasize to indicate a cancer that has gone to another part of your body. Treatment is still possible after either kind of recurrence. The cancer can still be controlled.  CAUSES OF RECURRENT CANCER No one knows exactly why breast cancer starts in the first place. Why the cancer comes back after treatment is also not clear. It is known that certain conditions, called risk factors, can make this more likely. They include:  Developing breast cancer for the first time before age 41.  Having breast cancer that involves the lymph nodes. These are small, round pieces of tissue found all over the body. Their job is to help fight infections.  Having a large tumor. Cancer is more apt to come back if the first tumor was bigger than 2 inches (5 cm).  Having certain types of breast cancer, such as:  Inflammatory breast cancer. This rare type grows rapidly and causes the breast to become red and swollen.  A high-grade tumor. The grade of a tumor indicates how fast it will grow and spread. High-grade tumors grow more quickly than other types.  HER2 cancer. This refers to the tumor's genetic makeup. Tumors that have this type of gene  are more likely to come back after treatment.  Having close tumor margins. This refers to the space between the tumor and normal, noncancerous cells. If the space is small, the tumor has a greater chance of coming back.  Having treatment involving a surgery to remove the tumor but not the entire breast (lumpectomy) and no radiation therapy. CARE AFTER BREAST CANCER Home Monitoring Women who have had breast cancer should continue to examine their breasts every month. The goal is to catch the cancer quickly if it comes back. Many women find it helpful to do so on the same day each month and to mark the calendar as a reminder. Let your caregiver know immediately if you have any signs of recurrent breast cancer. Symptoms will vary, depending on where the cancer recurs. The original type of treatment can also make a difference. Symptoms of local recurrence after a lumpectomy or a recurrence in the opposite breast may include:  A new lump or thickening in the breast.  A change in the way the skin looks on the breast (such as a rash, dimpling, or wrinkling).  Redness or swelling of the breast.  Changes  in the nipple (such as being red, puckered, swollen, or leaking fluid). Symptoms of a recurrence after a breast removal surgery (mastectomy) may include:  A lump or thickening under the skin.  A thickening around the mastectomy scar. Symptoms of regional recurrence in the lymph nodes near the breast may include:  A lump under the arm or above the collarbone.  Swelling of the arm.  Pain in the arm, shoulder, or chest.  Numbness in the hand or arm. Symptoms of distant recurrence may include:  A cough that does not go away.  Trouble breathing or shortness of breath.  Pain in the bones or the chest. This is pain that lasts or does not respond to rest and medicine.  Headaches.  Sudden vision problems.  Dizziness.  Nausea or vomiting.  Losing weight without trying to.  Persistent  abdominal pain.  Changes in bowel movements or blood in the stool.  Yellowing of the skin or eyes (jaundice).  Blood in the urine or bloody vaginal discharge. Clinical Monitoring  It is helpful to keep a schedule of appointments for needed tests and exams. This includes physical exams, breast exams, exams of the lymph nodes, and general exams.  For the first 3 years after being treated for breast cancer, see your caregiver every 3 to 6 months.  For years 4 and 5 after breast cancer, see your caregiver every 6 to 12 months.  After 5 years, see your caregiver at least once a year.  Regular breast X-rays (mammograms) should continue even if you had a mastectomy.  A mammogram should be done 1 year after the mammogram that first detected breast cancer.  A mammogram should be done every 6 to 12 months after that. Follow your caregiver's advice.  A pelvic exam done by your caregiver checks whether female organs are the normal size and shape. The exam is usually done every year. Ask your caregiver if that schedule is right for you.  Women taking tamoxifen should report any vaginal bleeding immediately to their caregiver. Tamoxifen is often given to women with a certain type of breast cancer. It has been shown to help prevent recurrence.  You will need to decide who your primary caregiver will be.  Most people continue to see their cancer specialist (oncologist) every 3 to 6 months for the first year after cancer treatment.  At some point, you may want to go back to seeing your family caregiver. You would no longer see your oncologist for regular checkups. Many women do this about 1 year after their first diagnosis of breast cancer.  You will still need to be seen every so often by your oncologist. Ask how often that should be. Coordinate this with your family or primary caregiver.  Think about having genetic counseling. This would provide information on traits that can be passed or  inherited from one generation to the next. In some cases, breast cancer runs in families. Tell your caregiver if you:  Are of Ashkenazi Jewish heritage.  Have any family member who has had ovarian cancer.  Have a mother, sister, or daughter who had breast cancer before age 32.  Have 2 or more close relatives who have had breast cancer. This means a mother, sister, daughter, aunt, or grandmother.  Had breast cancer in both breasts.  Have a female relative who has had breast cancer.  Some tests are not recommended for routine screening. Someone recovering from breast cancer does not need to have these tests if there are  no problems. The tests have risks, such as radiation exposure, and can be costly. The risks of these tests are thought to be greater than the benefits:  Blood tests.  Chest X-rays.  Bone scans.  Liver ultrasound.  Computed tomography (CT scan).  Positron emission tomography (PET scan).  Magnetic resonance imaging (MRI scan). DIAGNOSIS OF RECURRENT CANCER Recurrent breast cancer may be suspected for various reasons. A mammogram may not look normal. You might feel a lump or have other symptoms. Your caregiver may find something unusual during an exam. To be sure, your caregiver will probably order some tests. The tests are needed because there are symptoms or hints of a problem. They could include:  Blood tests, including a test to check how well the liver is working. The liver is a common site for a distant cancer recurrence.  Imaging tests that create pictures of the inside of the body. These tests include:  Chest X-rays to show if the cancer has come back in the lungs.  CT scans to create detailed pictures of various areas of the body and help find a distant recurrence.  MRI scans to find anything unusual in the breast, chest, or lymph nodes.  Breast ultrasound tests to examine the breasts.  Bone scans to create a picture of your whole skeleton and find cancer  in bony areas.  PET scans to create an image of the whole body. PET scans can be used together with CT scans to show more detail.  Biopsy. A small sample of tissue is taken and checked under a microscope. If cancer cells are found, they may be tested to see if they contain the HER2 gene or the hormones estrogen and progesterone. This will help your caregiver decide how to treat the recurrent cancer. TREATMENT  How recurrent breast cancer is treated depends on where the new cancer is found. The type of treatment that was used for the first breast cancer makes a difference, too. A combination of treatments may be used. Options include:  Surgery.  If the cancer comes back in the breast that was not treated before, you may need a lumpectomy or mastectomy.  If the cancer comes back in the breast that was treated before, you may need a mastectomy.  The lymph nodes under the arm may need to be removed.  Radiation therapy.  For a local recurrence, radiation may be used if it was not used during the first treatment.  For a distance recurrence, radiation is sometimes used.  Chemotherapy.  This may be used before surgery to treat recurrent breast cancer.  This may be used to treat recurrent cancer that cannot be treated with surgery.  This may be used to treat a distant recurrence.  Hormone therapy.  Women with the HER2 gene may be given hormone therapy to attack this gene. Document Released: 03/11/2011 Document Revised: 10/05/2011 Document Reviewed: 03/11/2011 [facility] Patient Information 2014 [place], [place].

## 2013-09-08 NOTE — Telephone Encounter (Signed)
appts made and printed. Pt is aware that i faxed the orders for her bone scan to the [facility] and will make sure that it gets scheduled.Marland Kitchentd

## 2013-09-08 NOTE — Progress Notes (Signed)
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 The right simple mastectomy with sentinel lymph node biopsy showed no evidence of malignancy.  #2 s/p bilateral breast reconstruction on the care of Dr. Theodoro Kos.  #3 s/p bilateral salpingo-oophorectomy on 02/15/2012.  #3 patient had genetic testing performed and she was BRCA 2 positive.  #4 patient has been on Arimidex 1 mg daily  CURRENT THERAPY:Arimidex 1 mg daily since March 2013  INTERVAL HISTORY: Samantha Mckay 48 y.o. female returns for followup visit. She is on Arimidex tolerating it well without any significant problems. She denies any fevers chills night sweats headaches shortness of breath chest pains palpitations no myalgias and arthralgias. Remainder of the 10 point review of systems is negative.  MEDICAL HISTORY: Past Medical History  Diagnosis Date  . Liver mass      numerous simple cysts throughout hepatic parenbhyma,one 10.7cm  . Breast cancer 09/22/11      right and left masectomy wirth expanders  . Breast cancer 09/21/11 [facility]    Left breast  masectomy-invasive ductal ca  . Breast cancer     ER/PR positive  . Cancer     breast    ALLERGIES:  has No Known Allergies.  MEDICATIONS:  Current Outpatient Prescriptions  Medication Sig Dispense Refill  . anastrozole (ARIMIDEX) 1 MG tablet Take 1 tablet (1 mg total) by mouth daily.  30 tablet  5  . calcium carbonate (OS-CAL) 600 MG TABS Take 600 mg by mouth 2 (two) times daily with a meal.      . cholecalciferol (VITAMIN D) 1000 UNITS tablet Take 1,000 Units by mouth 2 (two) times  daily.      . Multiple Vitamin (MULTIVITAMIN) tablet Take 1 tablet by mouth daily.      Marland Kitchen ibuprofen (ADVIL,MOTRIN) 200 MG tablet Take 200 mg by mouth every 6 (six) hours as needed. For pain       No current facility-administered medications for this visit.    SURGICAL HISTORY:  Past Surgical History  Procedure Laterality Date  . Mastectomy w/ sentinel node biopsy  09/21/2011    Procedure: MASTECTOMY WITH SENTINEL LYMPH NODE BIOPSY;  Surgeon: Imogene Burn. Georgette Dover, MD;  Location: [place];  Service: General;  Laterality: Bilateral;  Bilateral total mastectomies with left axillary sentinel lymph node biopsy.  Marland Kitchen Axillary lymph node dissection  09/21/2011    Procedure: AXILLARY LYMPH NODE DISSECTION;  Surgeon: Imogene Burn. Georgette Dover, MD;  Location: [place];  Service: General;  Laterality: Left;  . Tissue expander placement  09/21/2011    Procedure: TISSUE EXPANDER;  Surgeon: Theodoro Kos, DO;  Location: [place];  Service: Plastics;  Laterality: Bilateral;  Bilateral breast reconstruction with placement of bilateral tissue expanders.  . Intrauterine device insertion    . Laparoscopy  02/15/2012    Procedure: LAPAROSCOPY OPERATIVE;  Surgeon: Samantha Pearson, MD;  Location: [facility];  Service: Gynecology;  Laterality: N/A;  . Salpingoophorectomy  02/15/2012    Procedure: SALPINGO OOPHERECTOMY;  Surgeon: Samantha Pearson, MD;  Location: [facility];  Service: Gynecology;  Laterality: Bilateral;  . Iud removal  02/15/2012    Procedure: INTRAUTERINE DEVICE (IUD) REMOVAL;  Surgeon: Samantha Pearson, MD;  Location: [facility];  Service: Gynecology;  Laterality: N/A;  . Breast implant exchange Bilateral 11/17/2012    Procedure: BILATERAL REMOVAL OF TISSUE EXPANDER/PLACEMENT OF IMPLANTS;  Surgeon: Theodoro Kos, DO;  Location: [place];  Service: Plastics;  Laterality: Bilateral;    REVIEW OF SYSTEMS:  Pertinent items are noted in HPI.   HEALTH MAINTENANCE:   PHYSICAL EXAMINATION: Blood pressure 116/70, pulse 70,  temperature 98.5 F (36.9 C), temperature source Oral, resp. rate 18, height 5' 1"  (1.549 m), weight 165 lb 6.4 oz (75.025 kg). Body mass index is 31.27 kg/(m^2). ECOG PERFORMANCE STATUS: 0 - Asymptomatic   General appearance: alert, cooperative and appears stated age Lymph nodes: Cervical, supraclavicular, and axillary nodes normal. Resp: clear to auscultation bilaterally Cardio: regular rate and rhythm GI: soft, non-tender; bowel sounds normal; no masses,  no organomegaly Extremities: extremities normal, atraumatic, no cyanosis or edema Neurologic: Grossly normal   LABORATORY DATA: Lab Results  Component Value Date   WBC 4.5 09/08/2013   HGB 12.3 09/08/2013   HCT 38.7 09/08/2013   MCV 82.8 09/08/2013   PLT 187 09/08/2013      Chemistry      Component Value Date/Time   NA 140 03/10/2013 1152   NA 141 03/18/2012 1108   K 4.6 03/10/2013 1152   K 4.7 03/18/2012 1108   CL 107 06/21/2012 1536   CL 106 03/18/2012 1108   CO2 26 03/10/2013 1152   CO2 27 03/18/2012 1108   BUN 10.1 03/10/2013 1152   BUN 14 03/18/2012 1108   CREATININE 1.0 03/10/2013 1152   CREATININE 0.89 03/18/2012 1108      Component Value Date/Time   CALCIUM 9.6 03/10/2013 1152   CALCIUM 9.2 03/18/2012 1108   ALKPHOS 101 03/10/2013 1152   ALKPHOS 65 03/18/2012 1108   AST 23 03/10/2013 1152   AST 25 03/18/2012 1108   ALT 17 03/10/2013 1152   ALT 27 03/18/2012 1108   BILITOT 0.43 03/10/2013 1152   BILITOT 0.5 03/18/2012 1108       RADIOGRAPHIC STUDIES:  No results found.  ASSESSMENT/PLAN: 48 year old female with  #1 history of large volume DCIS of the left breast with associated microinvasive disease. Patient is status post bilateral simple mastectomy sentinel lymph node biopsies. Performed 09/21/2011. On the left she was noted to have one of 17 lymph nodes positive for metastatic ductal carcinoma. Right simple mastectomy revealed no evidence of lymph node positivity 2 lymph nodes were negative.  #2 patient has been  receiving Arimidex 1 mg daily total of 5-10 years of therapy is planned.  #3 patient also is status post bilateral salpingo-oophorectomy in 02/15/2012.  #4 patient is also BRCA 2 positive  #5 we will set her up for MRI of breasts  #6 patient will need bone density scans since she is post menopausal and on arimidex and thus at risk for osteoporosis  #7. Patient will be seen back in 6 months in follow up and   All questions were answered. The patient knows to call the clinic with any problems, questions or concerns. We can certainly see the patient much sooner if necessary.  I spent 25 minutes counseling the patient face to face. The total time spent in the appointment was 30 minutes.    Marcy Panning, MD Medical/Oncology [facility] [phone] (beeper) [phone] (Office)

## 2013-09-15 ENCOUNTER — Ambulatory Visit
Admission: RE | Admit: 2013-09-15 | Discharge: 2013-09-15 | Disposition: A | Discharge status: Home / Self Care | Payer: 59 | Source: Ambulatory Visit | Attending: Oncology | Admitting: Oncology

## 2013-09-15 DIAGNOSIS — C50919 Malignant neoplasm of unspecified site of unspecified female breast: Secondary | ICD-10-CM

## 2013-09-15 DIAGNOSIS — Z1501 Genetic susceptibility to malignant neoplasm of breast: Secondary | ICD-10-CM

## 2013-09-15 DIAGNOSIS — Z1509 Genetic susceptibility to other malignant neoplasm: Secondary | ICD-10-CM

## 2013-09-15 MED ORDER — GADOBENATE DIMEGLUMINE 529 MG/ML IV SOLN
15.0000 mL | Freq: Once | INTRAVENOUS | Status: AC | PRN
  Administered 2013-09-15: 15 mL via INTRAVENOUS

## 2013-09-18 ENCOUNTER — Telehealth: Payer: Self-pay | Admitting: *Deleted

## 2013-09-18 NOTE — Telephone Encounter (Signed)
Message copied by Hebert Soho on Mon Sep 18, 2013  5:00 PM ------      Message from: Deatra Robinson      Created: Mon Sep 18, 2013 10:32 AM       Mri: NO CANCER ------

## 2013-09-18 NOTE — Telephone Encounter (Signed)
As noted below by Dr. Humphrey Rolls, MRI shows no cancer. Patient verbalized understanding.

## 2013-09-27 ENCOUNTER — Ambulatory Visit
Admission: RE | Admit: 2013-09-27 | Discharge: 2013-09-27 | Disposition: A | Discharge status: Home / Self Care | Payer: 59 | Source: Ambulatory Visit | Attending: Oncology | Admitting: Oncology

## 2013-09-27 DIAGNOSIS — M8580 Other specified disorders of bone density and structure, unspecified site: Secondary | ICD-10-CM

## 2013-10-02 ENCOUNTER — Telehealth: Payer: Self-pay | Admitting: *Deleted

## 2013-10-02 NOTE — Telephone Encounter (Signed)
Pt notified of Bone density results per Dr Dawson Bills, no changes

## 2013-10-16 NOTE — Progress Notes (Signed)
Report Rcvd from [facility] dtd 09/27/13.  Provided to Rondo

## 2013-11-23 ENCOUNTER — Telehealth: Payer: Self-pay

## 2013-11-23 NOTE — Telephone Encounter (Signed)
LMOVM - bone density results normal.  Pt to call clinic with questions.

## 2014-03-19 ENCOUNTER — Other Ambulatory Visit: Payer: Managed Care, Other (non HMO)

## 2014-03-19 ENCOUNTER — Ambulatory Visit: Payer: Managed Care, Other (non HMO) | Admitting: Oncology

## 2014-03-20 ENCOUNTER — Other Ambulatory Visit: Payer: Self-pay | Admitting: *Deleted

## 2014-03-20 DIAGNOSIS — D0512 Intraductal carcinoma in situ of left breast: Secondary | ICD-10-CM

## 2014-03-21 ENCOUNTER — Ambulatory Visit (HOSPITAL_BASED_OUTPATIENT_CLINIC_OR_DEPARTMENT_OTHER): Payer: 59 | Admitting: Adult Health

## 2014-03-21 ENCOUNTER — Telehealth: Payer: Self-pay | Admitting: Adult Health

## 2014-03-21 ENCOUNTER — Other Ambulatory Visit (HOSPITAL_BASED_OUTPATIENT_CLINIC_OR_DEPARTMENT_OTHER): Payer: 59

## 2014-03-21 ENCOUNTER — Encounter: Payer: Self-pay | Admitting: Adult Health

## 2014-03-21 VITALS — BP 106/67 | HR 63 | Temp 99.4°F | Resp 18 | Ht 61.0 in | Wt 162.0 lb

## 2014-03-21 DIAGNOSIS — Z79811 Long term (current) use of aromatase inhibitors: Secondary | ICD-10-CM

## 2014-03-21 DIAGNOSIS — D0512 Intraductal carcinoma in situ of left breast: Secondary | ICD-10-CM

## 2014-03-21 DIAGNOSIS — Z853 Personal history of malignant neoplasm of breast: Secondary | ICD-10-CM

## 2014-03-21 LAB — CBC WITH DIFFERENTIAL/PLATELET
BASO%: 0.5 % (ref 0.0–2.0)
Basophils Absolute: 0 10*3/uL (ref 0.0–0.1)
EOS%: 1.9 % (ref 0.0–7.0)
Eosinophils Absolute: 0.1 10*3/uL (ref 0.0–0.5)
HCT: 38.7 % (ref 34.8–46.6)
HGB: 12.1 g/dL (ref 11.6–15.9)
LYMPH#: 2.1 10*3/uL (ref 0.9–3.3)
LYMPH%: 56.8 % — ABNORMAL HIGH (ref 14.0–49.7)
MCH: 25.8 pg (ref 25.1–34.0)
MCHC: 31.3 g/dL — AB (ref 31.5–36.0)
MCV: 82.6 fL (ref 79.5–101.0)
MONO#: 0.4 10*3/uL (ref 0.1–0.9)
MONO%: 10 % (ref 0.0–14.0)
NEUT#: 1.1 10*3/uL — ABNORMAL LOW (ref 1.5–6.5)
NEUT%: 30.8 % — ABNORMAL LOW (ref 38.4–76.8)
Platelets: 200 10*3/uL (ref 145–400)
RBC: 4.69 10*6/uL (ref 3.70–5.45)
RDW: 13 % (ref 11.2–14.5)
WBC: 3.7 10*3/uL — ABNORMAL LOW (ref 3.9–10.3)

## 2014-03-21 LAB — COMPREHENSIVE METABOLIC PANEL (CC13)
ALBUMIN: 3.8 g/dL (ref 3.5–5.0)
ALK PHOS: 105 U/L (ref 40–150)
ALT: 18 U/L (ref 0–55)
AST: 24 U/L (ref 5–34)
Anion Gap: 8 mEq/L (ref 3–11)
BUN: 15.1 mg/dL (ref 7.0–26.0)
CALCIUM: 9.1 mg/dL (ref 8.4–10.4)
CHLORIDE: 108 meq/L (ref 98–109)
CO2: 25 mEq/L (ref 22–29)
Creatinine: 0.8 mg/dL (ref 0.6–1.1)
Glucose: 90 mg/dl (ref 70–140)
POTASSIUM: 4.5 meq/L (ref 3.5–5.1)
Sodium: 141 mEq/L (ref 136–145)
Total Bilirubin: 0.43 mg/dL (ref 0.20–1.20)
Total Protein: 7.3 g/dL (ref 6.4–8.3)

## 2014-03-21 NOTE — Progress Notes (Signed)
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 The right simple mastectomy with sentinel lymph node biopsy showed no evidence of malignancy.  #2 s/p bilateral breast reconstruction on the care of Dr. Theodoro Kos.  #3 s/p bilateral salpingo-oophorectomy on 02/15/2012.  #3 patient had genetic testing performed and she was BRCA 2 positive.  #4 patient has been on Arimidex 1 mg daily  CURRENT THERAPY:Arimidex 1 mg daily since March 2013  INTERVAL HISTORY: Samantha Mckay 48 y.o. female returns for followup visit. Samantha Mckay is taking Arimidex every day and is tolerating it well.  She does have some sexual concerns due to vaginal dryness.  She also has dyspareunia.  She also had some left knee pain and saw an orthopedist in [facility], and received a cortisone shot in her knee and the pain improved greatly.  She says that her knee is about at 85% where it was previously at 20%.  She denies fevers, chills, nausea, vomiting, constipation, bowel/bladder changes, headaches, new pain or any further concern.  We updated her heatlh maintenance below.    MEDICAL HISTORY: Past Medical History  Diagnosis Date  . Liver mass      numerous simple cysts throughout hepatic parenbhyma,one 10.7cm  . Breast cancer 09/22/11      right and left masectomy wirth expanders  . Breast cancer 09/21/11 [facility]    Left breast  masectomy-invasive ductal ca  . Breast cancer     ER/PR positive  . Cancer     breast    ALLERGIES:  has No Known Allergies.  MEDICATIONS:  Current Outpatient Prescriptions  Medication Sig Dispense Refill  .  anastrozole (ARIMIDEX) 1 MG tablet Take 1 tablet (1 mg total) by mouth daily.  90 tablet  12  . calcium carbonate (OS-CAL) 600 MG TABS Take 600 mg by mouth 2 (two) times daily with a meal.      . cholecalciferol (VITAMIN D) 1000 UNITS tablet Take 1,000 Units by mouth 2 (two) times daily.      . Multiple Vitamin (MULTIVITAMIN) tablet Take 1 tablet by mouth daily.      Marland Kitchen ibuprofen (ADVIL,MOTRIN) 200 MG tablet Take 200 mg by mouth every 6 (six) hours as needed. For pain       No current facility-administered medications for this visit.    SURGICAL HISTORY:  Past Surgical History  Procedure Laterality Date  . Mastectomy w/ sentinel node biopsy  09/21/2011    Procedure: MASTECTOMY WITH SENTINEL LYMPH NODE BIOPSY;  Surgeon: Imogene Burn. Georgette Dover, MD;  Location: [facility];  Service: General;  Laterality: Bilateral;  Bilateral total mastectomies with left axillary sentinel lymph node biopsy.  Marland Kitchen Axillary lymph node dissection  09/21/2011    Procedure: AXILLARY LYMPH NODE DISSECTION;  Surgeon: Imogene Burn. Georgette Dover, MD;  Location: [facility];  Service: General;  Laterality: Left;  . Tissue expander placement  09/21/2011    Procedure: TISSUE EXPANDER;  Surgeon: Theodoro Kos, DO;  Location: [facility];  Service: Plastics;  Laterality: Bilateral;  Bilateral breast reconstruction with placement of bilateral tissue expanders.  . Intrauterine device insertion    . Laparoscopy  02/15/2012    Procedure: LAPAROSCOPY OPERATIVE;  Surgeon: Elzie Rings  Julien Girt, MD;  Location: [facility];  Service: Gynecology;  Laterality: N/A;  . Salpingoophorectomy  02/15/2012    Procedure: SALPINGO OOPHERECTOMY;  Surgeon: [name], MD;  Location: [facility];  Service: Gynecology;  Laterality: Bilateral;  . Iud removal  02/15/2012    Procedure: INTRAUTERINE DEVICE (IUD) REMOVAL;  Surgeon: [name], MD;  Location: [facility];  Service: Gynecology;  Laterality: N/A;  . Breast implant exchange Bilateral 11/17/2012    Procedure: BILATERAL REMOVAL OF TISSUE  EXPANDER/PLACEMENT OF IMPLANTS;  Surgeon: Theodoro Kos, DO;  Location: [facility];  Service: Plastics;  Laterality: Bilateral;    REVIEW OF SYSTEMS:  A 10 point review of systems was conducted and is otherwise negative except for what is noted above.     Health Maintenance  Mammogram: patient has MRI of breasts annually, she is BRCA 2 positive Colonoscopy: n/a Bone Density Scan: March, 2015, normal Pap Smear: due, sees Dr. Kathryne Eriksson Eye Exam: 02/2014 Vitamin D Level: to be collected today Lipid Panel: due    PHYSICAL EXAMINATION: Blood pressure 106/67, pulse 63, temperature 99.4 F (37.4 C), temperature source Oral, resp. rate 18, height 5' 1"  (1.549 m), weight 162 lb (73.483 kg). Body mass index is 30.63 kg/(m^2). GENERAL: Patient is a well appearing female in no acute distress HEENT:  Sclerae anicteric.  Oropharynx clear and moist. No ulcerations or evidence of oropharyngeal candidiasis. Neck is supple.  NODES:  No cervical, supraclavicular, or axillary lymphadenopathy palpated.  BREAST EXAM:  S/p bilateral mastectomy with reconstruction LUNGS:  Clear to auscultation bilaterally.  No wheezes or rhonchi. HEART:  Regular rate and rhythm. No murmur appreciated. ABDOMEN:  Soft, nontender.  Positive, normoactive bowel sounds. No organomegaly palpated. MSK:  No focal spinal tenderness to palpation. Full range of motion bilaterally in the upper extremities. EXTREMITIES:  No peripheral edema.   SKIN:  Clear with no obvious rashes or skin changes. No nail dyscrasia. NEURO:  Nonfocal. Well oriented.  Appropriate affect. ECOG PERFORMANCE STATUS: 0 - Asymptomatic   LABORATORY DATA: Lab Results  Component Value Date   WBC 3.7* 03/21/2014   HGB 12.1 03/21/2014   HCT 38.7 03/21/2014   MCV 82.6 03/21/2014   PLT 200 03/21/2014      Chemistry      Component Value Date/Time   NA 141 03/21/2014 1449   NA 141 03/18/2012 1108   K 4.5 03/21/2014 1449   K 4.7 03/18/2012 1108    CL 107 06/21/2012 1536   CL 106 03/18/2012 1108   CO2 25 03/21/2014 1449   CO2 27 03/18/2012 1108   BUN 15.1 03/21/2014 1449   BUN 14 03/18/2012 1108   CREATININE 0.8 03/21/2014 1449   CREATININE 0.89 03/18/2012 1108      Component Value Date/Time   CALCIUM 9.1 03/21/2014 1449   CALCIUM 9.2 03/18/2012 1108   ALKPHOS 105 03/21/2014 1449   ALKPHOS 65 03/18/2012 1108   AST 24 03/21/2014 1449   AST 25 03/18/2012 1108   ALT 18 03/21/2014 1449   ALT 27 03/18/2012 1108   BILITOT 0.43 03/21/2014 1449   BILITOT 0.5 03/18/2012 1108       RADIOGRAPHIC STUDIES:  No results found.  ASSESSMENT/PLAN: 48 year old female with  #1 history of large volume DCIS of the left breast with associated microinvasive disease. Patient is status post bilateral simple mastectomy sentinel lymph node biopsies. Performed 09/21/2011. On the left she was noted to have one of 17 lymph nodes positive for metastatic ductal carcinoma. Right simple mastectomy  revealed no evidence of lymph node positivity 2 lymph nodes were negative.  #2 patient has been receiving Arimidex 1 mg daily total of 5-10 years of therapy is planned.  She is tolerating this well and will continue this.    #3 BRCA 2 positivity, patient is status post bilateral salpingo-oophorectomy in 02/15/2012.  #4 Vaginal dryness and dyspareunia, we discussed in detail vaginal lubrication and dilation.  I also talked to her about physical therapy and gave her information on the upcoming Sexual Recovery program on 03/26/14.    We reviewed survivorship and I recommended healthy diet, exercise, and monthly breast exams.  Gatha will return in 6 months for labs and evaluation with Dr. Lindi Adie.    All questions were answered. The patient knows to call the clinic with any problems, questions or concerns. We can certainly see the patient much sooner if necessary.  I spent 25 minutes counseling the patient face to face. The total time spent in the appointment was 30  minutes.  Minette Headland, [place] [phone]

## 2014-03-21 NOTE — Telephone Encounter (Signed)
per pof top sch pt appt-gave pt copy od sch

## 2014-03-21 NOTE — Patient Instructions (Signed)
You are doing well.  You have no sign of recurrence.  Continue Arimidex.  I recommend healthy diet, exercise, and monthly breast exams.    Breast Self-Awareness Practicing breast self-awareness may pick up problems early, prevent significant medical complications, and possibly save your life. By practicing breast self-awareness, you can become familiar with how your breasts look and feel and if your breasts are changing. This allows you to notice changes early. It can also offer you some reassurance that your breast health is good. One way to learn what is normal for your breasts and whether your breasts are changing is to do a breast self-exam. If you find a lump or something that was not present in the past, it is best to contact your caregiver right away. Other findings that should be evaluated by your caregiver include nipple discharge, especially if it is bloody; skin changes or reddening; areas where the skin seems to be pulled in (retracted); or new lumps and bumps. Breast pain is seldom associated with cancer (malignancy), but should also be evaluated by a caregiver. HOW TO PERFORM A BREAST SELF-EXAM The best time to examine your breasts is 5-7 days after your menstrual period is over. During menstruation, the breasts are lumpier, and it may be more difficult to pick up changes. If you do not menstruate, have reached menopause, or had your uterus removed (hysterectomy), you should examine your breasts at regular intervals, such as monthly. If you are breastfeeding, examine your breasts after a feeding or after using a breast pump. Breast implants do not decrease the risk for lumps or tumors, so continue to perform breast self-exams as recommended. Talk to your caregiver about how to determine the difference between the implant and breast tissue. Also, talk about the amount of pressure you should use during the exam. Over time, you will become more familiar with the variations of your breasts and more  comfortable with the exam. A breast self-exam requires you to remove all your clothes above the waist. 1. Look at your breasts and nipples. Stand in front of a mirror in a room with good lighting. With your hands on your hips, push your hands firmly downward. Look for a difference in shape, contour, and size from one breast to the other (asymmetry). Asymmetry includes puckers, dips, or bumps. Also, look for skin changes, such as reddened or scaly areas on the breasts. Look for nipple changes, such as discharge, dimpling, repositioning, or redness. 2. Carefully feel your breasts. This is best done either in the shower or tub while using soapy water or when flat on your back. Place the arm (on the side of the breast you are examining) above your head. Use the pads (not the fingertips) of your three middle fingers on your opposite hand to feel your breasts. Start in the underarm area and use  inch (2 cm) overlapping circles to feel your breast. Use 3 different levels of pressure (light, medium, and firm pressure) at each circle before moving to the next circle. The light pressure is needed to feel the tissue closest to the skin. The medium pressure will help to feel breast tissue a little deeper, while the firm pressure is needed to feel the tissue close to the ribs. Continue the overlapping circles, moving downward over the breast until you feel your ribs below your breast. Then, move one finger-width towards the center of the body. Continue to use the  inch (2 cm) overlapping circles to feel your breast as you move  slowly up toward the collar bone (clavicle) near the base of the neck. Continue the up and down exam using all 3 pressures until you reach the middle of the chest. Do this with each breast, carefully feeling for lumps or changes. 3.  Keep a written record with breast changes or normal findings for each breast. By writing this information down, you do not need to depend only on memory for size,  tenderness, or location. Write down where you are in your menstrual cycle, if you are still menstruating. Breast tissue can have some lumps or thick tissue. However, see your caregiver if you find anything that concerns you.  SEEK MEDICAL CARE IF:  You see a change in shape, contour, or size of your breasts or nipples.   You see skin changes, such as reddened or scaly areas on the breasts or nipples.   You have an unusual discharge from your nipples.   You feel a new lump or unusually thick areas.  Document Released: 07/13/2005 Document Revised: 06/29/2012 Document Reviewed: 10/28/2011 [facility] Patient Information 2015 [place], [place]. This information is not intended to replace advice given to you by your health care provider. Make sure you discuss any questions you have with your health care provider.

## 2014-03-22 ENCOUNTER — Other Ambulatory Visit: Payer: Self-pay | Admitting: *Deleted

## 2014-03-22 ENCOUNTER — Telehealth: Payer: Self-pay | Admitting: *Deleted

## 2014-03-22 ENCOUNTER — Telehealth: Payer: Self-pay | Admitting: Adult Health

## 2014-03-22 DIAGNOSIS — D0510 Intraductal carcinoma in situ of unspecified breast: Secondary | ICD-10-CM

## 2014-03-22 LAB — VITAMIN D 25 HYDROXY (VIT D DEFICIENCY, FRACTURES): Vit D, 25-Hydroxy: 101 ng/mL — ABNORMAL HIGH (ref 30–89)

## 2014-03-22 NOTE — Telephone Encounter (Signed)
per pof to sch pt lab-nurse will call pt with time & date-per LC to sch

## 2014-06-22 ENCOUNTER — Other Ambulatory Visit: Payer: 59

## 2014-06-25 ENCOUNTER — Other Ambulatory Visit (HOSPITAL_BASED_OUTPATIENT_CLINIC_OR_DEPARTMENT_OTHER): Payer: 59

## 2014-06-25 DIAGNOSIS — D0510 Intraductal carcinoma in situ of unspecified breast: Secondary | ICD-10-CM

## 2014-06-25 DIAGNOSIS — Z853 Personal history of malignant neoplasm of breast: Secondary | ICD-10-CM

## 2014-06-26 LAB — VITAMIN D 25 HYDROXY (VIT D DEFICIENCY, FRACTURES): VIT D 25 HYDROXY: 77 ng/mL (ref 30–100)

## 2014-09-21 ENCOUNTER — Other Ambulatory Visit: Payer: Self-pay | Admitting: *Deleted

## 2014-09-21 DIAGNOSIS — D0510 Intraductal carcinoma in situ of unspecified breast: Secondary | ICD-10-CM

## 2014-09-24 ENCOUNTER — Telehealth: Payer: Self-pay | Admitting: Hematology and Oncology

## 2014-09-24 ENCOUNTER — Ambulatory Visit: Payer: 59 | Admitting: Hematology and Oncology

## 2014-09-24 ENCOUNTER — Other Ambulatory Visit: Payer: 59

## 2014-09-24 NOTE — Telephone Encounter (Signed)
Ret pt call and r.s todays appt  anne

## 2014-09-24 NOTE — Assessment & Plan Note (Signed)
Left breast DCIS with microcalcifications diagnosed December 2012 status post bilateral mastectomies 09/21/2011 final pathology revealed invasive ductal carcinoma 1/17 lymph nodes positive status post breast reconstruction by Dr. Migdalia Dk, status post bilateral salpingo-oophorectomy 02/15/2012 , BRCA2 positive   Current treatment: Arimidex 1 mg daily since March 2013  Arimidex toxicities: 1.  Vaginal dryness and dyspareunia 2.  Knee pain required cortisone injection   Breast cancer surveillance: 1.  Breast exam 09/24/2014 is normal 2.  No role of imaging studies since she had bilateral mastectomies , MRI done 2015 did not reveal abnormalities 3.  Bone density 09/27/2013 revealed T score of -0.3   Return to clinic in 6 months for follow-up

## 2014-10-01 ENCOUNTER — Other Ambulatory Visit: Payer: Self-pay | Admitting: Oncology

## 2014-10-15 ENCOUNTER — Other Ambulatory Visit (HOSPITAL_BASED_OUTPATIENT_CLINIC_OR_DEPARTMENT_OTHER): Payer: 59

## 2014-10-15 ENCOUNTER — Ambulatory Visit (HOSPITAL_BASED_OUTPATIENT_CLINIC_OR_DEPARTMENT_OTHER): Payer: 59 | Admitting: Hematology and Oncology

## 2014-10-15 ENCOUNTER — Telehealth: Payer: Self-pay | Admitting: Hematology and Oncology

## 2014-10-15 VITALS — BP 106/74 | HR 63 | Temp 97.6°F | Resp 18 | Ht 61.0 in | Wt 161.0 lb

## 2014-10-15 DIAGNOSIS — D0512 Intraductal carcinoma in situ of left breast: Secondary | ICD-10-CM

## 2014-10-15 DIAGNOSIS — C50912 Malignant neoplasm of unspecified site of left female breast: Secondary | ICD-10-CM

## 2014-10-15 DIAGNOSIS — C773 Secondary and unspecified malignant neoplasm of axilla and upper limb lymph nodes: Secondary | ICD-10-CM

## 2014-10-15 DIAGNOSIS — D0510 Intraductal carcinoma in situ of unspecified breast: Secondary | ICD-10-CM

## 2014-10-15 LAB — CBC WITH DIFFERENTIAL/PLATELET
BASO%: 0.5 % (ref 0.0–2.0)
BASOS ABS: 0 10*3/uL (ref 0.0–0.1)
EOS%: 1.8 % (ref 0.0–7.0)
Eosinophils Absolute: 0.1 10*3/uL (ref 0.0–0.5)
HCT: 40 % (ref 34.8–46.6)
HGB: 12.2 g/dL (ref 11.6–15.9)
LYMPH%: 51.8 % — ABNORMAL HIGH (ref 14.0–49.7)
MCH: 25.8 pg (ref 25.1–34.0)
MCHC: 30.6 g/dL — ABNORMAL LOW (ref 31.5–36.0)
MCV: 84.4 fL (ref 79.5–101.0)
MONO#: 0.3 10*3/uL (ref 0.1–0.9)
MONO%: 8.5 % (ref 0.0–14.0)
NEUT#: 1.5 10*3/uL (ref 1.5–6.5)
NEUT%: 37.4 % — AB (ref 38.4–76.8)
PLATELETS: 196 10*3/uL (ref 145–400)
RBC: 4.75 10*6/uL (ref 3.70–5.45)
RDW: 13.2 % (ref 11.2–14.5)
WBC: 3.9 10*3/uL (ref 3.9–10.3)
lymph#: 2 10*3/uL (ref 0.9–3.3)

## 2014-10-15 LAB — COMPREHENSIVE METABOLIC PANEL (CC13)
ALBUMIN: 3.6 g/dL (ref 3.5–5.0)
ALT: 24 U/L (ref 0–55)
ANION GAP: 9 meq/L (ref 3–11)
AST: 23 U/L (ref 5–34)
Alkaline Phosphatase: 91 U/L (ref 40–150)
BUN: 13.5 mg/dL (ref 7.0–26.0)
CALCIUM: 9.1 mg/dL (ref 8.4–10.4)
CHLORIDE: 108 meq/L (ref 98–109)
CO2: 25 meq/L (ref 22–29)
CREATININE: 0.8 mg/dL (ref 0.6–1.1)
EGFR: >90 mL/min/{1.73_m2} (ref >90)
Glucose: 115 mg/dl (ref 70–140)
POTASSIUM: 4.4 meq/L (ref 3.5–5.1)
Sodium: 142 mEq/L (ref 136–145)
Total Bilirubin: 0.34 mg/dL (ref 0.20–1.20)
Total Protein: 7.3 g/dL (ref 6.4–8.3)

## 2014-10-15 MED ORDER — ANASTROZOLE 1 MG PO TABS: 1.0000 mg | ORAL_TABLET | Freq: Every day | ORAL | Status: DC

## 2014-10-15 NOTE — Telephone Encounter (Signed)
per pof to sch pt appt-gave pt copy of sch °

## 2014-10-15 NOTE — Progress Notes (Signed)
Patient Care Team: [name], MD as PCP - General (Obstetrics and Gynecology)  DIAGNOSIS: 49 year old female with left breast invasive ductal carcinoma with microcalcifications diagnosed December 2012  PRIOR THERAPY:  #1 s/p bilateral simple mastectomies with sentinel lymph node biopsies on 09/21/2011. 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 The right simple mastectomy with sentinel lymph node biopsy showed no evidence of malignancy.  #2 s/p bilateral breast reconstruction on the care of Dr. Theodoro Kos.  #3 s/p bilateral salpingo-oophorectomy on 02/15/2012.  #3 patient had genetic testing performed and she was BRCA 2 positive.  #4 patient has been on Arimidex 1 mg daily  CURRENT THERAPY:Arimidex 1 mg daily since March 2013 CHIEF COMPLIANT: Follow-up of breast cancer on Arimidex  INTERVAL HISTORY: Samantha Mckay is a 49 year old with above-mentioned history of left breast invasive ductal carcinoma 0.1 cm in size T1 N1 M0 stage 2A status post bilateral mastectomies for BRCA2 mutation. She had one positive lymph node out of 17 in the left axilla. She is tolerating Arimidex extremely well without any major problems or concerns. Denies any lumps or nodules.  REVIEW OF SYSTEMS:   Constitutional: Denies fevers, chills or abnormal weight loss Eyes: Denies blurriness of vision Ears, nose, mouth, throat, and face: Denies mucositis or sore throat Respiratory: Denies cough, dyspnea or wheezes Cardiovascular: Denies palpitation, chest discomfort or lower extremity swelling Gastrointestinal:  Denies nausea, heartburn or change in bowel habits Skin: Denies abnormal skin rashes Lymphatics: Denies new lymphadenopathy or easy bruising Neurological:Denies numbness, tingling or new weaknesses Behavioral/Psych: Mood is stable, no new changes  Breast:  denies any pain or lumps or nodules in either breasts All other  systems were reviewed with the patient and are negative.  I have reviewed the past medical history, past surgical history, social history and family history with the patient and they are unchanged from previous note.  ALLERGIES:  has No Known Allergies.  MEDICATIONS:  Current Outpatient Prescriptions  Medication Sig Dispense Refill  . anastrozole (ARIMIDEX) 1 MG tablet Take 1 tablet (1 mg total) by mouth daily. 90 tablet 6  . calcium carbonate (OS-CAL) 600 MG TABS Take 600 mg by mouth 2 (two) times daily with a meal.    . cholecalciferol (VITAMIN D) 1000 UNITS tablet Take 1,000 Units by mouth 2 (two) times daily.    . Multiple Vitamin (MULTIVITAMIN) tablet Take 1 tablet by mouth daily.    Marland Kitchen ibuprofen (ADVIL,MOTRIN) 200 MG tablet Take 200 mg by mouth every 6 (six) hours as needed. For pain     No current facility-administered medications for this visit.    PHYSICAL EXAMINATION: ECOG PERFORMANCE STATUS: 0 - Asymptomatic  Filed Vitals:   10/15/14 1432  BP: 106/74  Pulse: 63  Temp: 97.6 F (36.4 C)  Resp: 18   Filed Weights   10/15/14 1432  Weight: 161 lb (73.029 kg)    GENERAL:alert, no distress and comfortable SKIN: skin color, texture, turgor are normal, no rashes or significant lesions EYES: normal, Conjunctiva are pink and non-injected, sclera clear OROPHARYNX:no exudate, no erythema and lips, buccal mucosa, and tongue normal  NECK: supple, thyroid normal size, non-tender, without nodularity LYMPH:  no palpable lymphadenopathy in the cervical, axillary or inguinal LUNGS: clear to auscultation and percussion with normal breathing effort HEART: regular rate & rhythm and no murmurs and no lower extremity edema ABDOMEN:abdomen soft, non-tender and normal bowel sounds Musculoskeletal:no cyanosis of digits and no clubbing  NEURO: alert &  oriented x 3 with fluent speech, no focal motor/sensory deficits BREAST: No palpable masses or nodules in either right or left breasts. Both  the right and left reconstructed breast appeared to be without any palpable abnormalities. No palpable axillary supraclavicular or infraclavicular adenopathy no breast tenderness. (exam performed in the presence of a chaperone)  LABORATORY DATA:  I have reviewed the data as listed   Chemistry      Component Value Date/Time   NA 142 10/15/2014 1419   NA 141 03/18/2012 1108   K 4.4 10/15/2014 1419   K 4.7 03/18/2012 1108   CL 107 06/21/2012 1536   CL 106 03/18/2012 1108   CO2 25 10/15/2014 1419   CO2 27 03/18/2012 1108   BUN 13.5 10/15/2014 1419   BUN 14 03/18/2012 1108   CREATININE 0.8 10/15/2014 1419   CREATININE 0.89 03/18/2012 1108      Component Value Date/Time   CALCIUM 9.1 10/15/2014 1419   CALCIUM 9.2 03/18/2012 1108   ALKPHOS 91 10/15/2014 1419   ALKPHOS 65 03/18/2012 1108   AST 23 10/15/2014 1419   AST 25 03/18/2012 1108   ALT 24 10/15/2014 1419   ALT 27 03/18/2012 1108   BILITOT 0.34 10/15/2014 1419   BILITOT 0.5 03/18/2012 1108       Lab Results  Component Value Date   WBC 3.9 10/15/2014   HGB 12.2 10/15/2014   HCT 40.0 10/15/2014   MCV 84.4 10/15/2014   PLT 196 10/15/2014   NEUTROABS 1.5 10/15/2014   ASSESSMENT & PLAN:  Ductal carcinoma in situ (DCIS) of left breast Left breast invasive ductal carcinoma with DCIS BRCA2 mutation positive: Status post bilateral mastectomies 09/21/2011 1/17 lymph nodes positive for metastatic disease T1 N1 a M0 stage II a Right breast mastectomy: No evidence of malignancy Status post bilateral reconstruction and bilateral salpingo-oophorectomy; currently on Arimidex 1 mg daily since March 2013  Arimidex toxicities: Tolerating it fairly well except for vaginal dryness and dyspareunia, intermittent hot flashes and muscle aches and pains  Breast cancer surveillance: 1. Examination of axilla and around the implants did not reveal any abnormalities 2. No role of imaging studies since she had bilateral mastectomies  Return to  clinic in 1 year for follow-up  Orders Placed This Encounter  Procedures  . CBC with Differential    Standing Status: Future     Number of Occurrences:      Standing Expiration Date: 10/15/2015  . Comprehensive metabolic panel (Cmet) - [facility]    Standing Status: Future     Number of Occurrences:      Standing Expiration Date: 10/15/2015   The patient has a good understanding of the overall plan. she agrees with it. She will call with any problems that may develop before her next visit here.   Rulon Eisenmenger, MD

## 2014-10-15 NOTE — Assessment & Plan Note (Signed)
Left breast invasive ductal carcinoma with DCIS BRCA2 mutation positive: Status post bilateral mastectomies 09/21/2011 1/17 lymph nodes positive for metastatic disease Right breast mastectomy: No evidence of malignancy Status post bilateral reconstruction and bilateral salpingo-oophorectomy; currently on Arimidex 1 mg daily since March 2013  Arimidex toxicities: Tolerating it fairly well except for vaginal dryness and dyspareunia, intermittent hot flashes and muscle aches and pains  Breast cancer surveillance: 1. Examination of axilla and around the implants did not reveal any abnormalities 2. No role of imaging studies since she had bilateral mastectomies  Return to clinic in 1 year for follow-up

## 2015-04-05 ENCOUNTER — Telehealth: Payer: Self-pay | Admitting: *Deleted

## 2015-04-05 NOTE — Telephone Encounter (Signed)
THE KNOT IS THE SIZE OF A DIME. IT IS NOT PAINFUL. WOULD LIKE AN APPOINTMENT TO BE SEEN.

## 2015-04-08 ENCOUNTER — Telehealth: Payer: Self-pay | Admitting: Hematology and Oncology

## 2015-04-08 ENCOUNTER — Other Ambulatory Visit: Payer: Self-pay | Admitting: *Deleted

## 2015-04-08 NOTE — Telephone Encounter (Signed)
Left message on voicemail for patient re f/u 9/13 @ 9:30 am.

## 2015-04-08 NOTE — Telephone Encounter (Signed)
PT. WILL SEE DR.GUDENA ON 04/09/15 AT 9:30AM. POF TO SCHEDULING.

## 2015-04-09 ENCOUNTER — Encounter: Payer: Self-pay | Admitting: Hematology and Oncology

## 2015-04-09 ENCOUNTER — Ambulatory Visit (HOSPITAL_BASED_OUTPATIENT_CLINIC_OR_DEPARTMENT_OTHER): Payer: 59 | Admitting: Hematology and Oncology

## 2015-04-09 ENCOUNTER — Telehealth: Payer: Self-pay | Admitting: Hematology and Oncology

## 2015-04-09 VITALS — BP 107/58 | HR 63 | Temp 97.8°F | Resp 18 | Ht 61.0 in | Wt 156.3 lb

## 2015-04-09 DIAGNOSIS — C50912 Malignant neoplasm of unspecified site of left female breast: Secondary | ICD-10-CM

## 2015-04-09 NOTE — Progress Notes (Signed)
Patient Care Team: [name], MD as PCP - General (Obstetrics and Gynecology)  DIAGNOSIS: Breast cancer, left breast   Staging form: Breast, AJCC 7th Edition     Clinical: No stage assigned - Unsigned     Pathologic: Stage IIA (T1a(2), N1, cM0) - Signed by Nicholas Lose, MD on 10/15/2014   SUMMARY OF ONCOLOGIC HISTORY:   Breast cancer, left breast   09/21/2011 Surgery Bilateral mastectomies: 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 Migdalia Dk   09/25/2011 -  Anti-estrogen oral therapy Arimidex 1 mg daily   02/15/2012 Surgery Bilateral salpingo-oophorectomy because she was BRCA2 mutation positive    CHIEF COMPLIANT: follow-up BREAST cancer BRCA2 mutation  INTERVAL HISTORY: Samantha Mckay is a 49 year old with above-mentioned history of BRCA2 mutation breast cancer with bilateral mastectomies as well as bilateral salpingo-oophorectomy. She is currently on oral Arimidex 1 mg daily since March 2013. She is tolerating it extremely well without any major problems. She does have some hot flashes or myalgias but overall able to manage it fairly well by exercising and staying active and busy. She complains of some fullness in the left axilla but other than that no complaints or concerns.  REVIEW OF SYSTEMS:   Constitutional: Denies fevers, chills or abnormal weight loss Eyes: Denies blurriness of vision Ears, nose, mouth, throat, and face: Denies mucositis or sore throat Respiratory: Denies cough, dyspnea or wheezes Cardiovascular: Denies palpitation, chest discomfort or lower extremity swelling Gastrointestinal:  Denies nausea, heartburn or change in bowel habits Skin: Denies abnormal skin rashes Lymphatics: Denies new lymphadenopathy or easy bruising Neurological:Denies numbness, tingling or new weaknesses Behavioral/Psych: Mood is stable, no new changes  Breast: left axillary  fullness All other systems were reviewed with the patient and are negative.  I have reviewed the past medical history, past surgical history, social history and family history with the patient and they are unchanged from previous note.  ALLERGIES:  has No Known Allergies.  MEDICATIONS:  Current Outpatient Prescriptions  Medication Sig Dispense Refill  . acetaminophen (TYLENOL) 500 MG tablet Take by mouth.    Marland Kitchen anastrozole (ARIMIDEX) 1 MG tablet Take 1 tablet (1 mg total) by mouth daily. 90 tablet 6  . calcium carbonate (OS-CAL) 600 MG TABS Take 600 mg by mouth 2 (two) times daily with a meal.    . cholecalciferol (VITAMIN D) 1000 UNITS tablet Take 1,000 Units by mouth 2 (two) times daily.    Marland Kitchen ibuprofen (ADVIL,MOTRIN) 200 MG tablet Take 200 mg by mouth every 6 (six) hours as needed. For pain    . methocarbamol (ROBAXIN) 500 MG tablet     . Multiple Vitamin (MULTIVITAMIN) tablet Take 1 tablet by mouth daily.     No current facility-administered medications for this visit.    PHYSICAL EXAMINATION: ECOG PERFORMANCE STATUS: 1 - Symptomatic but completely ambulatory  Filed Vitals:   04/09/15 0948  BP: 107/58  Pulse: 63  Temp: 97.8 F (36.6 C)  Resp: 18   Filed Weights   04/09/15 0948  Weight: 156 lb 4.8 oz (70.897 kg)    GENERAL:alert, no distress and comfortable SKIN: skin color, texture, turgor are normal, no rashes or significant lesions EYES: normal, Conjunctiva are pink and non-injected, sclera clear OROPHARYNX:no exudate, no erythema and lips, buccal mucosa, and tongue normal  NECK: supple, thyroid normal size, non-tender, without nodularity LYMPH:  no palpable lymphadenopathy in the cervical, axillary or inguinal LUNGS: clear to auscultation and  percussion with normal breathing effort HEART: regular rate & rhythm and no murmurs and no lower extremity edema ABDOMEN:abdomen soft, non-tender and normal bowel sounds Musculoskeletal:no cyanosis of digits and no clubbing   NEURO: alert & oriented x 3 with fluent speech, no focal motor/sensory deficits BREAST:no palpable lumps or nodules in the left axilla where she finds a fullness. There is no palpable abnormalities in the right axilla as well. (exam performed in the presence of a chaperone)  LABORATORY DATA:  I have reviewed the data as listed   Chemistry      Component Value Date/Time   NA 142 10/15/2014 1419   NA 141 03/18/2012 1108   K 4.4 10/15/2014 1419   K 4.7 03/18/2012 1108   CL 107 06/21/2012 1536   CL 106 03/18/2012 1108   CO2 25 10/15/2014 1419   CO2 27 03/18/2012 1108   BUN 13.5 10/15/2014 1419   BUN 14 03/18/2012 1108   CREATININE 0.8 10/15/2014 1419   CREATININE 0.89 03/18/2012 1108      Component Value Date/Time   CALCIUM 9.1 10/15/2014 1419   CALCIUM 9.2 03/18/2012 1108   ALKPHOS 91 10/15/2014 1419   ALKPHOS 65 03/18/2012 1108   AST 23 10/15/2014 1419   AST 25 03/18/2012 1108   ALT 24 10/15/2014 1419   ALT 27 03/18/2012 1108   BILITOT 0.34 10/15/2014 1419   BILITOT 0.5 03/18/2012 1108       Lab Results  Component Value Date   WBC 3.9 10/15/2014   HGB 12.2 10/15/2014   HCT 40.0 10/15/2014   MCV 84.4 10/15/2014   PLT 196 10/15/2014   NEUTROABS 1.5 10/15/2014    ASSESSMENT & PLAN:  Breast cancer, left breast 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 Examination of axilla and around the implants did not reveal any abnormalities. Patient felt fullness in the left axilla but I  did not palpate any nodularity abnormalities. It might be related to healing from prior surgery. If it persists and she is concerned, she can call us back and we might order an ultrasound of the axilla. 2. No role of imaging studies since she had bilateral mastectomies  Return to clinic in 1 year for follow-up   No orders of the defined types were placed in this encounter.   The patient has a good understanding of the overall plan. she agrees with it. she will call with any problems that may develop before the next visit here.   Rulon Eisenmenger, MD

## 2015-04-09 NOTE — Assessment & Plan Note (Signed)
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 Examination of axilla and around the implants did not reveal any abnormalities 2. No role of imaging studies since Samantha Mckay had bilateral mastectomies  Return to clinic in 1 year for follow-up

## 2015-04-09 NOTE — Telephone Encounter (Signed)
Appointments already made and avs printed for patient

## 2015-08-15 MED FILL — ANASTROZOLE 1 MG TABLET: 1 | 30 days supply | Qty: 30 | Fill #10

## 2015-09-18 MED FILL — ANASTROZOLE 1 MG TABLET: 1 | 30 days supply | Qty: 30 | Fill #11

## 2015-10-18 ENCOUNTER — Other Ambulatory Visit: Payer: Self-pay

## 2015-10-18 DIAGNOSIS — C50512 Malignant neoplasm of lower-outer quadrant of left female breast: Secondary | ICD-10-CM

## 2015-10-21 ENCOUNTER — Ambulatory Visit (HOSPITAL_BASED_OUTPATIENT_CLINIC_OR_DEPARTMENT_OTHER): Payer: 59 | Admitting: Hematology and Oncology

## 2015-10-21 ENCOUNTER — Other Ambulatory Visit: Payer: Self-pay | Admitting: Hematology and Oncology

## 2015-10-21 ENCOUNTER — Encounter: Payer: Self-pay | Admitting: Hematology and Oncology

## 2015-10-21 ENCOUNTER — Other Ambulatory Visit (HOSPITAL_BASED_OUTPATIENT_CLINIC_OR_DEPARTMENT_OTHER): Payer: BLUE CROSS/BLUE SHIELD

## 2015-10-21 ENCOUNTER — Telehealth: Payer: Self-pay | Admitting: Hematology and Oncology

## 2015-10-21 VITALS — BP 115/72 | HR 71 | Temp 98.0°F | Resp 18 | Wt 158.8 lb

## 2015-10-21 DIAGNOSIS — C50512 Malignant neoplasm of lower-outer quadrant of left female breast: Secondary | ICD-10-CM

## 2015-10-21 DIAGNOSIS — C50412 Malignant neoplasm of upper-outer quadrant of left female breast: Secondary | ICD-10-CM

## 2015-10-21 DIAGNOSIS — Z79811 Long term (current) use of aromatase inhibitors: Secondary | ICD-10-CM | POA: Diagnosis not present

## 2015-10-21 LAB — COMPREHENSIVE METABOLIC PANEL
ALT: 14 U/L (ref 0–55)
AST: 20 U/L (ref 5–34)
Albumin: 3.4 g/dL — ABNORMAL LOW (ref 3.5–5.0)
Alkaline Phosphatase: 89 U/L (ref 40–150)
Anion Gap: 5 mEq/L (ref 3–11)
BUN: 13.3 mg/dL (ref 7.0–26.0)
CHLORIDE: 110 meq/L — AB (ref 98–109)
CO2: 28 meq/L (ref 22–29)
Calcium: 9.1 mg/dL (ref 8.4–10.4)
Creatinine: 0.9 mg/dL (ref 0.6–1.1)
GLUCOSE: 91 mg/dL (ref 70–140)
POTASSIUM: 4.8 meq/L (ref 3.5–5.1)
SODIUM: 142 meq/L (ref 136–145)
Total Bilirubin: 0.42 mg/dL (ref 0.20–1.20)
Total Protein: 7 g/dL (ref 6.4–8.3)

## 2015-10-21 LAB — CBC WITH DIFFERENTIAL/PLATELET
BASO%: 0.4 % (ref 0.0–2.0)
BASOS ABS: 0 10*3/uL (ref 0.0–0.1)
EOS ABS: 0.1 10*3/uL (ref 0.0–0.5)
EOS%: 2.8 % (ref 0.0–7.0)
HCT: 39.4 % (ref 34.8–46.6)
HGB: 12.5 g/dL (ref 11.6–15.9)
LYMPH%: 56.4 % — AB (ref 14.0–49.7)
MCH: 26.2 pg (ref 25.1–34.0)
MCHC: 31.7 g/dL (ref 31.5–36.0)
MCV: 82.7 fL (ref 79.5–101.0)
MONO#: 0.4 10*3/uL (ref 0.1–0.9)
MONO%: 9.9 % (ref 0.0–14.0)
NEUT%: 30.5 % — ABNORMAL LOW (ref 38.4–76.8)
NEUTROS ABS: 1.2 10*3/uL — AB (ref 1.5–6.5)
Platelets: 192 10*3/uL (ref 145–400)
RBC: 4.77 10*6/uL (ref 3.70–5.45)
RDW: 12.9 % (ref 11.2–14.5)
WBC: 4 10*3/uL (ref 3.9–10.3)
lymph#: 2.3 10*3/uL (ref 0.9–3.3)

## 2015-10-21 MED FILL — ANASTROZOLE 1 MG TABLET: 1 | 90 days supply | Qty: 90 | Fill #0

## 2015-10-21 NOTE — Telephone Encounter (Signed)
Chart reviewed.

## 2015-10-21 NOTE — Assessment & Plan Note (Signed)
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 Examination of axilla and around the implants did not reveal any abnormalities. Patient felt fullness in the left axilla but I did not palpate any nodularity abnormalities.  2. No role of imaging studies since she had bilateral mastectomies  Return to clinic in 1 year for follow-up

## 2015-10-21 NOTE — Progress Notes (Signed)
Unable to get in to exam room prior to MD.  No assessment performed.  

## 2015-10-21 NOTE — Telephone Encounter (Signed)
Gave and printed appt sched and avs for pt for March 2018

## 2015-10-21 NOTE — Progress Notes (Signed)
Patient Care Team: Samantha Pearson, MD as PCP - General (Obstetrics and Gynecology)  DIAGNOSIS: Breast cancer of upper-outer quadrant of left female breast [facility])   Staging form: Breast, AJCC 7th Edition     Clinical: No stage assigned - Unsigned     Pathologic: Stage IIA (T1a(2), N1, cM0) - Signed by Samantha Lose, MD on 10/15/2014   SUMMARY OF ONCOLOGIC HISTORY:   Breast cancer of upper-outer quadrant of left female breast (Samantha Mckay)   09/21/2011 Surgery Bilateral mastectomies: Left mastectomy showed invasive ductal carcinoma 1/17 lymph nodes positive; right mastectomy no malignancy followed by bilateral breast reconstruction by Samantha Mckay   09/25/2011 -  Anti-estrogen oral therapy Arimidex 1 mg daily   02/15/2012 Surgery Bilateral salpingo-oophorectomy because she was BRCA2 mutation positive    CHIEF COMPLIANT: follow-up on anastrozole  INTERVAL HISTORY: Samantha Mckay is a 50 year old with above-mentioned history of left breast cancer. Bilateral mastectomies followed by breast reconstruction and is currently on oral antiestrogen therapy with Arimidex. She has taken it since March 2013. She completed 4 years of therapy. She was BRCA2 mutation positive and underwent bilateral salpingo-oophorectomy July 2013. She reports vaginal dryness is much better since she started using cocaine 5. She has very minimal hot flashes and myalgias.  REVIEW OF SYSTEMS:   Constitutional: Denies fevers, chills or abnormal weight loss Eyes: Denies blurriness of vision Ears, nose, mouth, throat, and face: Denies mucositis or sore throat Respiratory: Denies cough, dyspnea or wheezes Cardiovascular: Denies palpitation, chest discomfort Gastrointestinal:  Denies nausea, heartburn or change in bowel habits Skin: Denies abnormal skin rashes Lymphatics: Denies new lymphadenopathy or easy bruising Neurological:Denies numbness, tingling or new weaknesses Behavioral/Psych: Mood is stable, no new changes  Extremities:  No lower extremity edema Breast:  denies any pain or lumps or nodules in either breasts All other systems were reviewed with the patient and are negative.  I have reviewed the past medical history, past surgical history, social history and family history with the patient and they are unchanged from previous note.  ALLERGIES:  has No Known Allergies.  MEDICATIONS:  Current Outpatient Prescriptions  Medication Sig Dispense Refill  . acetaminophen (TYLENOL) 500 MG tablet Take by mouth.    Marland Kitchen anastrozole (ARIMIDEX) 1 MG tablet Take 1 tablet (1 mg total) by mouth daily. 90 tablet 6  . calcium carbonate (OS-CAL) 600 MG TABS Take 600 mg by mouth 2 (two) times daily with a meal.    . cholecalciferol (VITAMIN D) 1000 UNITS tablet Take 1,000 Units by mouth 2 (two) times daily.    Marland Kitchen ibuprofen (ADVIL,MOTRIN) 200 MG tablet Take 200 mg by mouth every 6 (six) hours as needed. For pain    . methocarbamol (ROBAXIN) 500 MG tablet     . Multiple Vitamin (MULTIVITAMIN) tablet Take 1 tablet by mouth daily.     No current facility-administered medications for this visit.    PHYSICAL EXAMINATION: ECOG PERFORMANCE STATUS: 0 - Asymptomatic  Filed Vitals:   10/21/15 1459  BP: 115/72  Pulse: 71  Temp: 98 F (36.7 C)  Resp: 18   Filed Weights   10/21/15 1459  Weight: 158 lb 12.8 oz (72.031 kg)    GENERAL:alert, no distress and comfortable SKIN: skin color, texture, turgor are normal, no rashes or significant lesions EYES: normal, Conjunctiva are pink and non-injected, sclera clear OROPHARYNX:no exudate, no erythema and lips, buccal mucosa, and tongue normal  NECK: supple, thyroid normal size, non-tender, without nodularity LYMPH:  no palpable lymphadenopathy in the cervical, axillary or  inguinal LUNGS: clear to auscultation and percussion with normal breathing effort HEART: regular rate & rhythm and no murmurs and no lower extremity edema ABDOMEN:abdomen soft, non-tender and normal bowel  sounds MUSCULOSKELETAL:no cyanosis of digits and no clubbing  NEURO: alert & oriented x 3 with fluent speech, no focal motor/sensory deficits EXTREMITIES: No lower extremity edema BREAST: No palpable masses or nodules in either right or left breasts. No palpable axillary supraclavicular or infraclavicular adenopathy no breast tenderness or nipple discharge. (exam performed in the presence of a chaperone)  LABORATORY DATA:  I have reviewed the data as listed   Chemistry      Component Value Date/Time   NA 142 10/15/2014 1419   NA 141 03/18/2012 1108   K 4.4 10/15/2014 1419   K 4.7 03/18/2012 1108   CL 107 06/21/2012 1536   CL 106 03/18/2012 1108   CO2 25 10/15/2014 1419   CO2 27 03/18/2012 1108   BUN 13.5 10/15/2014 1419   BUN 14 03/18/2012 1108   CREATININE 0.8 10/15/2014 1419   CREATININE 0.89 03/18/2012 1108      Component Value Date/Time   CALCIUM 9.1 10/15/2014 1419   CALCIUM 9.2 03/18/2012 1108   ALKPHOS 91 10/15/2014 1419   ALKPHOS 65 03/18/2012 1108   AST 23 10/15/2014 1419   AST 25 03/18/2012 1108   ALT 24 10/15/2014 1419   ALT 27 03/18/2012 1108   BILITOT 0.34 10/15/2014 1419   BILITOT 0.5 03/18/2012 1108       Lab Results  Component Value Date   WBC 4.0 10/21/2015   HGB 12.5 10/21/2015   HCT 39.4 10/21/2015   MCV 82.7 10/21/2015   PLT 192 10/21/2015   NEUTROABS 1.2* 10/21/2015     ASSESSMENT & PLAN:  Breast cancer of upper-outer quadrant of left female breast ([place]) 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 I discussed with the patient the role of extended adjuvant therapy. We discussed the results of MA 17 clinical trial. I would like to send for breast cancer and asked to determine if she would benefit from extended adjuvant therapy.  Breast cancer surveillance: 1. Examination of axilla and around the implants did not reveal any abnormalities. Patient felt fullness in the left axilla but I did not palpate any nodularity abnormalities.  2. No role of imaging studies since she had bilateral mastectomies  Return to clinic in 1 year for follow-up   No orders of the defined types were placed in this encounter.   The patient has a good understanding of the overall plan. she agrees with it. she will call with any problems that may develop before the next visit here.   Rulon Eisenmenger, MD 10/21/2015

## 2015-10-25 ENCOUNTER — Encounter: Payer: Self-pay | Admitting: *Deleted

## 2015-10-25 NOTE — Progress Notes (Signed)
Ordered BCI per Dr. Lindi Adie.  Faxed requisition to [facility]. Informed pathology.

## 2015-11-25 ENCOUNTER — Encounter: Payer: Self-pay | Admitting: *Deleted

## 2015-11-25 ENCOUNTER — Encounter (HOSPITAL_COMMUNITY): Payer: Self-pay

## 2015-11-25 NOTE — Progress Notes (Signed)
Received notice of  insufficient specimen for BCI.

## 2016-01-29 MED FILL — ANASTROZOLE 1 MG TABLET: 1 | 90 days supply | Qty: 90 | Fill #1

## 2016-05-18 MED FILL — ANASTROZOLE 1 MG TABLET: 1 | 90 days supply | Qty: 90 | Fill #2

## 2016-09-02 MED FILL — ANASTROZOLE 1 MG TABLET: 1 | 90 days supply | Qty: 90 | Fill #3

## 2016-09-22 ENCOUNTER — Telehealth: Payer: Self-pay | Admitting: Hematology and Oncology

## 2016-09-22 NOTE — Telephone Encounter (Signed)
Spoke with patient and confirm appointment change °

## 2016-10-19 ENCOUNTER — Encounter: Payer: Self-pay | Admitting: Hematology and Oncology

## 2016-10-19 ENCOUNTER — Encounter: Payer: Self-pay | Admitting: *Deleted

## 2016-10-19 ENCOUNTER — Ambulatory Visit (HOSPITAL_BASED_OUTPATIENT_CLINIC_OR_DEPARTMENT_OTHER): Payer: BLUE CROSS/BLUE SHIELD | Admitting: Hematology and Oncology

## 2016-10-19 ENCOUNTER — Encounter (INDEPENDENT_AMBULATORY_CARE_PROVIDER_SITE_OTHER): Payer: Self-pay

## 2016-10-19 DIAGNOSIS — Z170 Estrogen receptor positive status [ER+]: Secondary | ICD-10-CM

## 2016-10-19 DIAGNOSIS — C50412 Malignant neoplasm of upper-outer quadrant of left female breast: Secondary | ICD-10-CM

## 2016-10-19 DIAGNOSIS — C773 Secondary and unspecified malignant neoplasm of axilla and upper limb lymph nodes: Secondary | ICD-10-CM

## 2016-10-19 DIAGNOSIS — Z79811 Long term (current) use of aromatase inhibitors: Secondary | ICD-10-CM | POA: Diagnosis not present

## 2016-10-19 NOTE — Progress Notes (Signed)
Patient Care Team: [name], MD as PCP - General (Obstetrics and Gynecology)  DIAGNOSIS:  Encounter Diagnosis  Name Primary?  . Malignant neoplasm of upper-outer quadrant of left breast in female, estrogen receptor positive ([place])     SUMMARY OF ONCOLOGIC HISTORY:   Breast cancer of upper-outer quadrant of left female breast ([place])   09/21/2011 Surgery    Bilateral mastectomies: 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 Migdalia Dk      02/15/2012 Surgery    Bilateral salpingo-oophorectomy because she was BRCA2 mutation positive      03/27/2012 -  Anti-estrogen oral therapy    Arimidex 1 mg daily       CHIEF COMPLIANT: Follow-up of prior breast cancer  INTERVAL HISTORY: Samantha Mckay is a 51 year old with above-mentioned history of BRCA2 mutation underwent bilateral mastectomies and bilateral salpingo-oophorectomy. She is currently on Arimidex therapy since September 2013. She is tolerating it extremely well. She does have occasional myalgias and arthralgias. She stays very busy taking care of her 3 children. She is going through a divorce.  REVIEW OF SYSTEMS:   Constitutional: Denies fevers, chills or abnormal weight loss Eyes: Denies blurriness of vision Ears, nose, mouth, throat, and face: Denies mucositis or sore throat Respiratory: Denies cough, dyspnea or wheezes Cardiovascular: Denies palpitation, chest discomfort Gastrointestinal:  Denies nausea, heartburn or change in bowel habits Skin: Denies abnormal skin rashes Lymphatics: Denies new lymphadenopathy or easy bruising Neurological:Denies numbness, tingling or new weaknesses Behavioral/Psych: Mood is stable, no new changes  Extremities: No lower extremity edema Breast:  Bilateral mastectomies with reconstruction All other systems were reviewed with the patient and are negative.  I have reviewed  the past medical history, past surgical history, social history and family history with the patient and they are unchanged from previous note.  ALLERGIES:  has No Known Allergies.  MEDICATIONS:  Current Outpatient Prescriptions  Medication Sig Dispense Refill  . acetaminophen (TYLENOL) 500 MG tablet Take by mouth.    Marland Kitchen anastrozole (ARIMIDEX) 1 MG tablet TAKE 1 TABLET BY MOUTH ONCE DAILY 90 tablet 3  . calcium carbonate (OS-CAL) 600 MG TABS Take 600 mg by mouth 2 (two) times daily with a meal.    . cholecalciferol (VITAMIN D) 1000 UNITS tablet Take 1,000 Units by mouth 2 (two) times daily.    Marland Kitchen ibuprofen (ADVIL,MOTRIN) 200 MG tablet Take 200 mg by mouth every 6 (six) hours as needed. For pain    . methocarbamol (ROBAXIN) 500 MG tablet     . Multiple Vitamin (MULTIVITAMIN) tablet Take 1 tablet by mouth daily.     No current facility-administered medications for this visit.     PHYSICAL EXAMINATION: ECOG PERFORMANCE STATUS: 1 - Symptomatic but completely ambulatory  Vitals:   10/19/16 1059  BP: 97/83  Pulse: 86  Resp: 18  Temp: 97.8 F (36.6 C)   Filed Weights   10/19/16 1059  Weight: 164 lb 6.4 oz (74.6 kg)    GENERAL:alert, no distress and comfortable SKIN: skin color, texture, turgor are normal, no rashes or significant lesions EYES: normal, Conjunctiva are pink and non-injected, sclera clear OROPHARYNX:no exudate, no erythema and lips, buccal mucosa, and tongue normal  NECK: supple, thyroid normal size, non-tender, without nodularity LYMPH:  no palpable lymphadenopathy in the cervical, axillary or inguinal LUNGS: clear to auscultation and percussion with normal breathing effort HEART: regular rate & rhythm and no murmurs and no lower extremity edema  ABDOMEN:abdomen soft, non-tender and normal bowel sounds MUSCULOSKELETAL:no cyanosis of digits and no clubbing  NEURO: alert & oriented x 3 with fluent speech, no focal motor/sensory deficits EXTREMITIES: No lower extremity  edema BREAST: No palpable masses or nodules in either right or left Reconstructed breasts. No palpable axillary supraclavicular or infraclavicular adenopathy no breast tenderness. (exam performed in the presence of a chaperone)  LABORATORY DATA:  I have reviewed the data as listed   Chemistry      Component Value Date/Time   NA 142 10/21/2015 1444   K 4.8 10/21/2015 1444   CL 107 06/21/2012 1536   CO2 28 10/21/2015 1444   BUN 13.3 10/21/2015 1444   CREATININE 0.9 10/21/2015 1444      Component Value Date/Time   CALCIUM 9.1 10/21/2015 1444   ALKPHOS 89 10/21/2015 1444   AST 20 10/21/2015 1444   ALT 14 10/21/2015 1444   BILITOT 0.42 10/21/2015 1444       Lab Results  Component Value Date   WBC 4.0 10/21/2015   HGB 12.5 10/21/2015   HCT 39.4 10/21/2015   MCV 82.7 10/21/2015   PLT 192 10/21/2015   NEUTROABS 1.2 (L) 10/21/2015    ASSESSMENT & PLAN:  Breast cancer of upper-outer quadrant of left female breast ([place]) 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 Breast cancer index was sent but there was insufficient tissue and the test could not be performed. I recommended that the patient receive Arimidex at least for 7 years based on the data from [place] revealed that 7 years of aromatase inhibitor therapy was equivalent to 10 years  Breast cancer surveillance: 1. Examination of axilla and around the implants did not reveal any abnormalities.  2. No role of imaging studies since she had bilateral  mastectomies  Return to clinic in 1 year for follow-up with survivorship clinic   I spent 15 minutes talking to the patient of which more than half was spent in counseling and coordination of care.  Orders Placed This Encounter  Procedures  . Amb Referral to Survivorship Long term    Referral Priority:   Routine    Referral Type:   Consultation    Number of Visits Requested:   1   The patient has a good understanding of the overall plan. she agrees with it. she will call with any problems that may develop before the next visit here.   Rulon Eisenmenger, MD 10/19/16

## 2016-10-19 NOTE — Assessment & Plan Note (Signed)
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 Breast cancer index was sent but there was insufficient tissue and the test could not be performed. I recommended that the patient receive Arimidex at least for 7 years based on the data from [place] revealed that 7 years of aromatase inhibitor therapy was equivalent to 10 years  Breast cancer surveillance: 1. Examination of axilla and around the implants did not reveal any abnormalities. Patient felt fullness in the left axilla but I did not palpate any nodularity abnormalities.  2. No role of imaging studies since she had bilateral mastectomies  Return to clinic in 1 year for follow-up

## 2016-12-07 ENCOUNTER — Other Ambulatory Visit: Payer: Self-pay | Admitting: Hematology and Oncology

## 2016-12-08 MED FILL — ANASTROZOLE 1 MG TABLET: 1 | 90 days supply | Qty: 90 | Fill #0

## 2017-03-18 MED FILL — ANASTROZOLE 1 MG TABLET: 1 | 90 days supply | Qty: 90 | Fill #1

## 2017-05-25 ENCOUNTER — Telehealth: Payer: Self-pay

## 2017-05-25 NOTE — Telephone Encounter (Signed)
Pt calling to see if she can file for disability since she hasn't been at work since March 2018. Pt is on anastrozole and because of side effects, pt unable to work full time. Pt has since been unemployed and thinking of getting disability. Wanted to see MD to see if she can get disability due to her current treatment. Pt doesn't have insurance anymore and wants to see if she could get assistance with an MD visit fee. Will forward request to manage care for resources. Pt confirmed time/date of appt with Dr.Gudena next week. No further needs at this time.

## 2017-06-03 ENCOUNTER — Telehealth: Payer: Self-pay | Admitting: Hematology and Oncology

## 2017-06-03 ENCOUNTER — Ambulatory Visit (HOSPITAL_BASED_OUTPATIENT_CLINIC_OR_DEPARTMENT_OTHER): Payer: Self-pay | Admitting: Hematology and Oncology

## 2017-06-03 DIAGNOSIS — Z170 Estrogen receptor positive status [ER+]: Secondary | ICD-10-CM

## 2017-06-03 DIAGNOSIS — C50412 Malignant neoplasm of upper-outer quadrant of left female breast: Secondary | ICD-10-CM

## 2017-06-03 NOTE — Assessment & Plan Note (Signed)
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 Breast cancer index: insufficient tissue  I recommended that the patient receive Arimidex at least for 7 years based on the data from [facility] 16which revealed that 7 years of aromatase inhibitor therapy was equivalent to 10 years  Breast cancer surveillance: 1. Examination of axilla and around the implants did not reveal any abnormalities.  2. No role of imaging studies since she had bilateral mastectomies  Return to clinic in 1 year for follow-up with survivorship clinic    

## 2017-06-03 NOTE — Telephone Encounter (Signed)
Gave patient avs report and appointments for November 2019 °

## 2017-06-03 NOTE — Progress Notes (Signed)
Patient Care Team: Samantha Pearson, MD as PCP - General (Obstetrics and Gynecology)  DIAGNOSIS:  Encounter Diagnosis  Name Primary?  . Malignant neoplasm of upper-outer quadrant of left breast in female, estrogen receptor positive (Samantha Mckay)     SUMMARY OF ONCOLOGIC HISTORY:   Breast cancer of upper-outer quadrant of left female breast (Samantha Mckay)   09/21/2011 Surgery    Bilateral mastectomies: 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 Migdalia Mckay      02/15/2012 Surgery    Bilateral salpingo-oophorectomy because she was BRCA2 mutation positive      03/27/2012 - 06/03/2017 Anti-estrogen oral therapy    Arimidex 1 mg daily       CHIEF COMPLIANT: Follow-up on Arimidex therapy  INTERVAL HISTORY: Samantha Mckay is a 51 year old with above-mentioned history of bilateral mastectomies for left-sided breast cancer along with bilateral salpingo-nephrectomies because she had BRCA2 mutation.  She is currently on Arimidex therapy since 2013.  She had completed 5 years of treatment.  She is here today for routine follow-up.  She reports no new problems or concerns.  Denies any lumps or nodules in the breast or chest wall.  She is very sad because she lost her job and was not unable to find a new job.  REVIEW OF SYSTEMS:   Constitutional: Denies fevers, chills or abnormal weight loss Eyes: Denies blurriness of vision Ears, nose, mouth, throat, and face: Denies mucositis or sore throat Respiratory: Denies cough, dyspnea or wheezes Cardiovascular: Denies palpitation, chest discomfort Gastrointestinal:  Denies nausea, heartburn or change in bowel habits Skin: Denies abnormal skin rashes Lymphatics: Denies new lymphadenopathy or easy bruising Neurological:Denies numbness, tingling or new weaknesses Behavioral/Psych: Mood is stable, no new changes  Extremities: No lower extremity edema  All  other systems were reviewed with the patient and are negative.  I have reviewed the past medical history, past surgical history, social history and family history with the patient and they are unchanged from previous note.  ALLERGIES:  has No Known Allergies.  MEDICATIONS:  Current Outpatient Medications  Medication Sig Dispense Refill  . acetaminophen (TYLENOL) 500 MG tablet Take by mouth.    . calcium carbonate (OS-CAL) 600 MG TABS Take 600 mg by mouth 2 (two) times daily with a meal.    . cholecalciferol (VITAMIN D) 1000 UNITS tablet Take 1,000 Units by mouth 2 (two) times daily.    Marland Kitchen ibuprofen (ADVIL,MOTRIN) 200 MG tablet Take 200 mg by mouth every 6 (six) hours as needed. For pain    . methocarbamol (ROBAXIN) 500 MG tablet     . Multiple Vitamin (MULTIVITAMIN) tablet Take 1 tablet by mouth daily.     No current facility-administered medications for this visit.     PHYSICAL EXAMINATION: ECOG PERFORMANCE STATUS: 1 - Symptomatic but completely ambulatory  Vitals:   06/03/17 1046  BP: 111/64  Pulse: 75  Resp: 18  Temp: 98.3 F (36.8 C)  SpO2: 100%   Filed Weights   06/03/17 1046  Weight: 165 lb 6.4 oz (75 kg)    GENERAL:alert, no distress and comfortable SKIN: skin color, texture, turgor are normal, no rashes or significant lesions EYES: normal, Conjunctiva are pink and non-injected, sclera clear OROPHARYNX:no exudate, no erythema and lips, buccal mucosa, and tongue normal  NECK: supple, thyroid normal size, non-tender, without nodularity LYMPH:  no palpable lymphadenopathy in the cervical, axillary or inguinal LUNGS: clear to auscultation and percussion with normal breathing effort  HEART: regular rate & rhythm and no murmurs and no lower extremity edema ABDOMEN:abdomen soft, non-tender and normal bowel sounds MUSCULOSKELETAL:no cyanosis of digits and no clubbing  NEURO: alert & oriented x 3 with fluent speech, no focal motor/sensory deficits EXTREMITIES: No lower  extremity edema BREAST: No palpable lumps or nodules in bilateral reconstructed breasts. (exam performed in the presence of a chaperone)  LABORATORY DATA:  I have reviewed the data as listed   Chemistry      Component Value Date/Time   NA 142 10/21/2015 1444   K 4.8 10/21/2015 1444   CL 107 06/21/2012 1536   CO2 28 10/21/2015 1444   BUN 13.3 10/21/2015 1444   CREATININE 0.9 10/21/2015 1444      Component Value Date/Time   CALCIUM 9.1 10/21/2015 1444   ALKPHOS 89 10/21/2015 1444   AST 20 10/21/2015 1444   ALT 14 10/21/2015 1444   BILITOT 0.42 10/21/2015 1444       Lab Results  Component Value Date   WBC 4.0 10/21/2015   HGB 12.5 10/21/2015   HCT 39.4 10/21/2015   MCV 82.7 10/21/2015   PLT 192 10/21/2015   NEUTROABS 1.2 (L) 10/21/2015    ASSESSMENT & PLAN:  Breast cancer of upper-outer quadrant of left female breast ([name]) 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 Breast cancer index: insufficient tissue  We discussed the pros and cons of extending her antiestrogen therapy.  Patient tells me that she does not feel like herself and her quality of life has been impaired because of anastrozole.  Because of this reason we decided to discontinue further antiestrogen treatment.  Breast cancer surveillance: 1. Examination of axilla and around the implants did not reveal any abnormalities.  2. No role of imaging studies since she had bilateral mastectomies  Return to clinic in 1 year for follow-up with survivorship  clinic   I spent 25 minutes talking to the patient of which more than half was spent in counseling and coordination of care.  No orders of the defined types were placed in this encounter.  The patient has a good understanding of the overall plan. she agrees with it. she will call with any problems that may develop before the next visit here.   Rulon Eisenmenger, MD 06/03/17

## 2017-10-21 ENCOUNTER — Encounter: Payer: BLUE CROSS/BLUE SHIELD | Admitting: Adult Health

## 2017-12-15 ENCOUNTER — Telehealth: Payer: Self-pay | Admitting: Hematology and Oncology

## 2017-12-15 NOTE — Telephone Encounter (Signed)
Returned patient call re needing an appointment but having no insurance. Patient currently scheduled for LTS visit in November.  Left message confirming this appointment and informing patient that if she has a medical concern or needs a sooner for a medical concern to please call the office to address and if an appointment is needed we can work through the options re having no insurance after addressint  the medical need.

## 2017-12-17 ENCOUNTER — Telehealth: Payer: Self-pay | Admitting: *Deleted

## 2017-12-17 NOTE — Telephone Encounter (Signed)
Patient called and left a message regarding the need for an appt. I have called back and left a messgae to call the office.

## 2018-06-02 ENCOUNTER — Inpatient Hospital Stay: Payer: Self-pay | Admitting: Adult Health

## 2018-07-12 ENCOUNTER — Other Ambulatory Visit: Payer: Self-pay

## 2018-07-12 DIAGNOSIS — C50412 Malignant neoplasm of upper-outer quadrant of left female breast: Secondary | ICD-10-CM

## 2018-07-12 DIAGNOSIS — Z170 Estrogen receptor positive status [ER+]: Principal | ICD-10-CM

## 2018-08-11 ENCOUNTER — Telehealth: Payer: Self-pay | Admitting: Adult Health

## 2018-08-11 ENCOUNTER — Other Ambulatory Visit: Payer: Self-pay | Admitting: *Deleted

## 2018-08-11 ENCOUNTER — Encounter: Payer: Self-pay | Admitting: Adult Health

## 2018-08-11 ENCOUNTER — Inpatient Hospital Stay: Payer: BLUE CROSS/BLUE SHIELD | Attending: Adult Health | Admitting: Adult Health

## 2018-08-11 ENCOUNTER — Inpatient Hospital Stay: Payer: BLUE CROSS/BLUE SHIELD

## 2018-08-11 VITALS — BP 106/74 | HR 74 | Temp 97.6°F | Resp 20 | Ht 61.0 in | Wt 168.6 lb

## 2018-08-11 DIAGNOSIS — Z1501 Genetic susceptibility to malignant neoplasm of breast: Secondary | ICD-10-CM

## 2018-08-11 DIAGNOSIS — Z800 Family history of malignant neoplasm of digestive organs: Secondary | ICD-10-CM | POA: Diagnosis not present

## 2018-08-11 DIAGNOSIS — M25511 Pain in right shoulder: Secondary | ICD-10-CM

## 2018-08-11 DIAGNOSIS — Z79899 Other long term (current) drug therapy: Secondary | ICD-10-CM | POA: Insufficient documentation

## 2018-08-11 DIAGNOSIS — Z853 Personal history of malignant neoplasm of breast: Secondary | ICD-10-CM | POA: Insufficient documentation

## 2018-08-11 DIAGNOSIS — M25512 Pain in left shoulder: Secondary | ICD-10-CM | POA: Diagnosis not present

## 2018-08-11 DIAGNOSIS — E2839 Other primary ovarian failure: Secondary | ICD-10-CM

## 2018-08-11 DIAGNOSIS — M545 Low back pain, unspecified: Secondary | ICD-10-CM

## 2018-08-11 DIAGNOSIS — G8929 Other chronic pain: Secondary | ICD-10-CM

## 2018-08-11 DIAGNOSIS — Z170 Estrogen receptor positive status [ER+]: Secondary | ICD-10-CM

## 2018-08-11 DIAGNOSIS — M25519 Pain in unspecified shoulder: Secondary | ICD-10-CM | POA: Insufficient documentation

## 2018-08-11 DIAGNOSIS — Z1509 Genetic susceptibility to other malignant neoplasm: Secondary | ICD-10-CM

## 2018-08-11 DIAGNOSIS — Z791 Long term (current) use of non-steroidal anti-inflammatories (NSAID): Secondary | ICD-10-CM | POA: Diagnosis not present

## 2018-08-11 DIAGNOSIS — Z1211 Encounter for screening for malignant neoplasm of colon: Secondary | ICD-10-CM

## 2018-08-11 DIAGNOSIS — C50412 Malignant neoplasm of upper-outer quadrant of left female breast: Secondary | ICD-10-CM

## 2018-08-11 LAB — CMP (CANCER CENTER ONLY)
ALT: 17 U/L (ref 0–44)
AST: 21 U/L (ref 15–41)
Albumin: 3.8 g/dL (ref 3.5–5.0)
Alkaline Phosphatase: 102 U/L (ref 38–126)
Anion gap: 6 (ref 5–15)
BUN: 14 mg/dL (ref 6–20)
CO2: 27 mmol/L (ref 22–32)
Calcium: 9.2 mg/dL (ref 8.9–10.3)
Chloride: 108 mmol/L (ref 98–111)
Creatinine: 0.89 mg/dL (ref 0.44–1.00)
GFR, Est AFR Am: >60 mL/min (ref >60)
Glucose, Bld: 93 mg/dL (ref 70–99)
Potassium: 4.6 mmol/L (ref 3.5–5.1)
Sodium: 141 mmol/L (ref 135–145)
Total Bilirubin: 0.4 mg/dL (ref 0.3–1.2)
Total Protein: 7.5 g/dL (ref 6.5–8.1)

## 2018-08-11 LAB — CBC WITH DIFFERENTIAL (CANCER CENTER ONLY)
Abs Immature Granulocytes: 0.01 10*3/uL (ref 0.00–0.07)
BASOS PCT: 0 %
Basophils Absolute: 0 10*3/uL (ref 0.0–0.1)
Eosinophils Absolute: 0.1 10*3/uL (ref 0.0–0.5)
Eosinophils Relative: 2 %
HCT: 39.9 % (ref 36.0–46.0)
Hemoglobin: 12.3 g/dL (ref 12.0–15.0)
Immature Granulocytes: 0 %
Lymphocytes Relative: 49 %
Lymphs Abs: 2.1 10*3/uL (ref 0.7–4.0)
MCH: 26.2 pg (ref 26.0–34.0)
MCHC: 30.8 g/dL (ref 30.0–36.0)
MCV: 84.9 fL (ref 80.0–100.0)
Monocytes Absolute: 0.4 10*3/uL (ref 0.1–1.0)
Monocytes Relative: 10 %
NRBC: 0 % (ref 0.0–0.2)
Neutro Abs: 1.6 10*3/uL — ABNORMAL LOW (ref 1.7–7.7)
Neutrophils Relative %: 39 %
Platelet Count: 227 10*3/uL (ref 150–400)
RBC: 4.7 MIL/uL (ref 3.87–5.11)
RDW: 12.7 % (ref 11.5–15.5)
WBC: 4.2 10*3/uL (ref 4.0–10.5)

## 2018-08-11 NOTE — Progress Notes (Signed)
CLINIC:  Survivorship   REASON FOR VISIT:  Routine follow-up for history of breast cancer.   BRIEF ONCOLOGIC HISTORY:    Breast cancer of upper-outer quadrant of left female breast ([place])   09/21/2011 Surgery    Bilateral mastectomies: 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 Migdalia Dk    02/15/2012 Surgery    Bilateral salpingo-oophorectomy because she was BRCA2 mutation positive    03/27/2012 - 06/03/2017 Anti-estrogen oral therapy    Arimidex 1 mg daily      INTERVAL HISTORY:  Samantha Mckay presents to the [facility] today for routine follow-up for her history of breast cancer.  Overall, she reports feeling quite well.  Samantha Mckay has had continued lower back pain and shoulder pain.  The shoulder pain is new.  She noted some aching while taking the Arimidex, however sometimes the pain interferes with her sleep.  Her left arm where she had lymph nodes removed is starting to get weak.  She feels like her hand strength is worsening in the left than the right.  Her right leg also bothers her a lot.  She has a varicose vein that has enlarged and has become more prominent that is bothering her.    Samantha Mckay was seeing Dr. Julien Girt but hasn't seen her since ovary removal. She hasn't had regular follow up with anyone else other than Dr. Lindi Adie.  She had breast MRI in 2015 that was normal, and she also underwent bone density testing in 2015 that was normal at the time as well.  I talked to her about her mom with stomach cancer.  I asked her which part of her stomach her cancer was in.  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  She thinks it may have been pancreatic cancer.     REVIEW OF SYSTEMS:  Review of Systems  Constitutional: Negative for appetite change, chills, fatigue, fever and unexpected weight change.  HENT:   Negative for hearing loss, lump/mass and  trouble swallowing.   Eyes: Negative for eye problems and icterus.  Respiratory: Negative for chest tightness, cough and shortness of breath.   Cardiovascular: Negative for chest pain, leg swelling and palpitations.  Gastrointestinal: Negative for abdominal distention, abdominal pain, constipation, diarrhea, nausea and vomiting.  Endocrine: Negative for hot flashes.  Musculoskeletal: Positive for arthralgias and back pain.  Skin: Negative for itching and rash.  Neurological: Negative for dizziness, extremity weakness, headaches and numbness.  Hematological: Negative for adenopathy. Does not bruise/bleed easily.  Psychiatric/Behavioral: Negative for depression. The patient is not nervous/anxious.   Breast: Denies any new nodularity, masses, tenderness, nipple changes, or nipple discharge.       PAST MEDICAL/SURGICAL HISTORY:  Past Medical History:  Diagnosis Date  . Breast cancer ([place]) 09/22/11     right and left masectomy wirth expanders  . Breast cancer ([place]) 09/21/11 BX   Left breast  masectomy-invasive ductal ca  . Breast cancer ([place])    ER/PR positive  . Cancer ([place])    breast  . Liver mass     numerous simple cysts throughout hepatic parenbhyma,one 10.7cm   Past Surgical History:  Procedure Laterality Date  . AXILLARY LYMPH NODE DISSECTION  09/21/2011   Procedure: AXILLARY LYMPH NODE DISSECTION;  Surgeon: Imogene Burn. Georgette Dover, MD;  Location: [place];  Service: General;  Laterality: Left;  . BREAST IMPLANT EXCHANGE Bilateral 11/17/2012   Procedure: BILATERAL REMOVAL OF  TISSUE EXPANDER/PLACEMENT OF IMPLANTS;  Surgeon: Theodoro Kos, DO;  Location: [place];  Service: Plastics;  Laterality: Bilateral;  . INTRAUTERINE DEVICE INSERTION    . IUD REMOVAL  02/15/2012   Procedure: INTRAUTERINE DEVICE (IUD) REMOVAL;  Surgeon: [name], MD;  Location: [facility];  Service: Gynecology;  Laterality: N/A;  . LAPAROSCOPY  02/15/2012   Procedure: LAPAROSCOPY OPERATIVE;  Surgeon:  [name], MD;  Location: [facility];  Service: Gynecology;  Laterality: N/A;  . MASTECTOMY W/ SENTINEL NODE BIOPSY  09/21/2011   Procedure: MASTECTOMY WITH SENTINEL LYMPH NODE BIOPSY;  Surgeon: Imogene Burn. Georgette Dover, MD;  Location: [place];  Service: General;  Laterality: Bilateral;  Bilateral total mastectomies with left axillary sentinel lymph node biopsy.  Marland Kitchen SALPINGOOPHORECTOMY  02/15/2012   Procedure: SALPINGO OOPHERECTOMY;  Surgeon: [name], MD;  Location: [facility];  Service: Gynecology;  Laterality: Bilateral;  . TISSUE EXPANDER PLACEMENT  09/21/2011   Procedure: TISSUE EXPANDER;  Surgeon: Theodoro Kos, DO;  Location: [facility];  Service: Plastics;  Laterality: Bilateral;  Bilateral breast reconstruction with placement of bilateral tissue expanders.     ALLERGIES:  No Known Allergies   CURRENT MEDICATIONS:  Outpatient Encounter Medications as of 08/11/2018  Medication Sig Note  . acetaminophen (TYLENOL) 500 MG tablet Take by mouth. 04/09/2015: Received from: [facility]  . calcium carbonate (OS-CAL) 600 MG TABS Take 600 mg by mouth 2 (two) times daily with a meal.   . cholecalciferol (VITAMIN D) 1000 UNITS tablet Take 1,000 Units by mouth 2 (two) times daily.   Marland Kitchen ibuprofen (ADVIL,MOTRIN) 200 MG tablet Take 200 mg by mouth every 6 (six) hours as needed. For pain   . Multiple Vitamin (MULTIVITAMIN) tablet Take 1 tablet by mouth daily.   . [DISCONTINUED] methocarbamol (ROBAXIN) 500 MG tablet  04/09/2015: Received from: External Pharmacy   No facility-administered encounter medications on file as of 08/11/2018.      ONCOLOGIC FAMILY HISTORY:  Family History  Problem Relation Age of Onset  . Cancer Mother        stomach  . Anesthesia problems Neg Hx   . Hypotension Neg Hx   . Malignant hyperthermia Neg Hx   . Pseudochol deficiency Neg Hx     GENETIC COUNSELING/TESTING: BRCA 2 positive  SOCIAL HISTORY:  Social History   Socioeconomic History  . Marital status: Married     Spouse name: Not on file  . Number of children: Not on file  . Years of education: Not on file  . Highest education level: Not on file  Occupational History  . Not on file  Social Needs  . Financial resource strain: Not on file  . Food insecurity:    Worry: Not on file    Inability: Not on file  . Transportation needs:    Medical: Not on file    Non-medical: Not on file  Tobacco Use  . Smoking status: Never Smoker  . Smokeless tobacco: Never Used  . Tobacco comment: occ glass of wine  Substance and Sexual Activity  . Alcohol use: Yes  . Drug use: No  . Sexual activity: Yes    Birth control/protection: I.U.D.    Comment: menarche age 2,1st preg.,age 34,hormones-yes  Lifestyle  . Physical activity:    Days per week: Not on file    Minutes per session: Not on file  . Stress: Not on file  Relationships  . Social connections:    Talks on phone: Not on file  Gets together: Not on file    Attends religious service: Not on file    Active member of club or organization: Not on file    Attends meetings of clubs or organizations: Not on file    Relationship status: Not on file  . Intimate partner violence:    Fear of current or ex partner: Not on file    Emotionally abused: Not on file    Physically abused: Not on file    Forced sexual activity: Not on file  Other Topics Concern  . Not on file  Social History Narrative  . Not on file     PHYSICAL EXAMINATION:  Vital Signs: Vitals:   08/11/18 1454  BP: 106/74  Pulse: 74  Resp: 20  Temp: 97.6 F (36.4 C)  SpO2: 100%   Filed Weights   08/11/18 1454  Weight: 168 lb 9.6 oz (76.5 kg)   General: Well-nourished, well-appearing female in no acute distress.  Unaccompanied today.   HEENT: Head is normocephalic.  Pupils equal and reactive to light. Conjunctivae clear without exudate.  Sclerae anicteric. Oral mucosa is pink, moist.  Oropharynx is pink without lesions or erythema.  Lymph: No cervical, supraclavicular, or  infraclavicular lymphadenopathy noted on palpation.  Cardiovascular: Regular rate and rhythm.Marland Kitchen Respiratory: Clear to auscultation bilaterally. Chest expansion symmetric; breathing non-labored.  Breast Exam:  Breast: s/p bilateral mastectomies and reconstruction, no sign of local recurrence noted -Axilla: No axillary adenopathy bilaterally.  GI: Abdomen soft and round; non-tender, non-distended. Bowel sounds normoactive. No hepatosplenomegaly.   GU: Deferred.  Neuro: No focal deficits. Steady gait.  Psych: Mood and affect normal and appropriate for situation.  MSK: No focal spinal tenderness to palpation, full range of motion in bilateral upper extremities Extremities: No edema. Skin: Warm and dry.  LABORATORY DATA:  None for this visit   DIAGNOSTIC IMAGING:     ASSESSMENT AND PLAN:  Ms.. Mckay is a pleasant 53 y.o. female with history of Stage IIA left breast invasive ductal carcinoma, ER+/PR+/HER2-, diagnosed in 2013, treated with bilateral mastectomies, and anti-estrogen therapy with Anastrozole x 5 years completing therapy in 05/2017.  She presents to the Survivorship Clinic for surveillance and routine follow-up.   1. History of breast cancer:  Samantha Mckay is  Doing well and is without evidence of disease or recurrence of breast cancer.  She no longer requires breast cancer screening, but had been having breast MRI, most recently in 2015.  I let her know that I will defer to Dr. Migdalia Dk regarding when to do her next breast MRI and let her follow that if needed.  She will return in one year for labs and LTS follow up.  I encouraged her to call me with any questions or concerns before her next visit at the cancer center, and I would be happy to see her sooner, if needed.    2. BRCA2 positivity: We reviewed recommendations about this.  She has undergone risk reducing salpingo oophorectomy.  I referred her to dermatology for an annual skin examination.  I ordered MRCP for evaluation  based on the fact that she has FH of pancreatic cancer.    3. Bone health:  Given Samantha Mckay's age, history of breast cancer, and her previous anti-estrogen therapy with Anastrozole, she is at risk for bone demineralization. Her last DEXA was in 2015 and was normal.  She would like another one repeated.  I have ordered this.  She was given education on specific food and activities to promote  bone health.  4. Cancer screening:  Due to Samantha Mckay history and her age, she should receive screening for skin cancers, colon cancer, and pancreatic cancer. She was encouraged to get a PCP.  Since she doesn't have a PCP yet, I referred her to Dr. Silverio Decamp for colonoscopy and ordered the MRCP.  I also placed a referral to dermatology.  5. Health maintenance and wellness promotion: Samantha Mckay was encouraged to consume 5-7 servings of fruits and vegetables per day. She was also encouraged to engage in moderate to vigorous exercise for 30 minutes per day most days of the week. She was instructed to limit her alcohol consumption and continue to abstain from tobacco use.  6. Back and shoulder pain: ? Arthritis etiology, will get plain films today.    Dispo:  -Return to cancer center in one year for LTS follow up -MRCP -Bone density -referral to GI for colonscopy -referral to dermatology   A total of (30) minutes of face-to-face time was spent with this patient with greater than 50% of that time in counseling and care-coordination.   [name], NP Survivorship Program [place] [phone]   Note: PRIMARY CARE PROVIDER [name], [place] [phone]

## 2018-08-11 NOTE — Telephone Encounter (Signed)
Gave avs and calendar ° °

## 2018-08-12 ENCOUNTER — Encounter: Payer: Self-pay | Admitting: Gastroenterology

## 2018-08-23 ENCOUNTER — Ambulatory Visit (AMBULATORY_SURGERY_CENTER): Payer: Self-pay | Admitting: *Deleted

## 2018-08-23 ENCOUNTER — Other Ambulatory Visit: Payer: Self-pay

## 2018-08-23 ENCOUNTER — Ambulatory Visit (HOSPITAL_COMMUNITY): Payer: BLUE CROSS/BLUE SHIELD

## 2018-08-23 ENCOUNTER — Ambulatory Visit (HOSPITAL_COMMUNITY)
Admission: RE | Admit: 2018-08-23 | Discharge: 2018-08-23 | Disposition: A | Discharge status: Home / Self Care | Payer: BLUE CROSS/BLUE SHIELD | Source: Ambulatory Visit | Attending: Adult Health | Admitting: Adult Health

## 2018-08-23 VITALS — Ht 61.0 in | Wt 168.4 lb

## 2018-08-23 DIAGNOSIS — C50412 Malignant neoplasm of upper-outer quadrant of left female breast: Secondary | ICD-10-CM | POA: Insufficient documentation

## 2018-08-23 DIAGNOSIS — Z170 Estrogen receptor positive status [ER+]: Secondary | ICD-10-CM | POA: Diagnosis present

## 2018-08-23 DIAGNOSIS — M545 Low back pain: Secondary | ICD-10-CM | POA: Insufficient documentation

## 2018-08-23 DIAGNOSIS — Z1211 Encounter for screening for malignant neoplasm of colon: Secondary | ICD-10-CM

## 2018-08-23 DIAGNOSIS — M25512 Pain in left shoulder: Secondary | ICD-10-CM | POA: Insufficient documentation

## 2018-08-23 DIAGNOSIS — M25511 Pain in right shoulder: Secondary | ICD-10-CM | POA: Diagnosis present

## 2018-08-23 DIAGNOSIS — G8929 Other chronic pain: Secondary | ICD-10-CM | POA: Insufficient documentation

## 2018-08-23 MED ORDER — SUPREP BOWEL PREP KIT 17.5-3.13-1.6 GM/177ML PO SOLN: 1.0000 | Freq: Once | ORAL | 0 refills | Status: AC

## 2018-08-23 MED FILL — SUPREP BOWEL PREP KIT: 17.5-3.13-1 | 2 days supply | Qty: 354 | Fill #0

## 2018-08-23 NOTE — Progress Notes (Signed)
No egg or soy allergy known to patient  No issues with past sedation with any surgeries  or procedures, no intubation problems  No diet pills per patient No home 02 use per patient  No blood thinners per patient  Pt denies issues with constipation  No A fib or A flutter  EMMI video sent to pt's e mail15.00 suprep coupon given No sticks on left

## 2018-08-24 ENCOUNTER — Telehealth: Payer: Self-pay

## 2018-08-24 NOTE — Telephone Encounter (Signed)
-----   Message from [name], NP sent at 08/24/2018  6:55 AM EST ----- Please let patient know that xrays are negative in the shoulders, mild degeneration noted in lower back.  No cancer anywhere.  Would recommend aleve if needed.   ----- Message ----- From: Interface, Rad Results In Sent: 08/24/2018   1:07 AM EST To: [name], NP

## 2018-08-24 NOTE — Telephone Encounter (Signed)
Spoke with patient informing of xray results that are negative in the shoulders and mild degeneration in the lower back.  Patient informed there is no cancer anywhere.  NP recommends taking Aleve for pain.  Patient voiced understanding and had no questions/concerns at this time.

## 2018-09-06 ENCOUNTER — Ambulatory Visit (AMBULATORY_SURGERY_CENTER): Payer: BLUE CROSS/BLUE SHIELD | Admitting: Gastroenterology

## 2018-09-06 ENCOUNTER — Encounter: Payer: Self-pay | Admitting: Gastroenterology

## 2018-09-06 ENCOUNTER — Telehealth: Payer: Self-pay | Admitting: *Deleted

## 2018-09-06 VITALS — BP 123/79 | HR 73 | Temp 98.0°F | Resp 11 | Ht 61.0 in | Wt 168.0 lb

## 2018-09-06 DIAGNOSIS — K635 Polyp of colon: Secondary | ICD-10-CM

## 2018-09-06 DIAGNOSIS — Z1211 Encounter for screening for malignant neoplasm of colon: Secondary | ICD-10-CM

## 2018-09-06 DIAGNOSIS — D125 Benign neoplasm of sigmoid colon: Secondary | ICD-10-CM

## 2018-09-06 MED ORDER — SODIUM CHLORIDE 0.9 % IV SOLN: 500.0000 mL | Freq: Once | INTRAVENOUS | Status: DC

## 2018-09-06 NOTE — Telephone Encounter (Signed)
Medical records faxed to [facility]; [facility]#[phone]

## 2018-09-06 NOTE — Progress Notes (Signed)
Spontaneous respirations throughout. VSS. Resting comfortably. To PACU on room air. Report to  RN. 

## 2018-09-06 NOTE — Patient Instructions (Signed)
YOU HAD AN ENDOSCOPIC PROCEDURE TODAY AT [facility]:   Refer to the procedure report that was given to you for any specific questions about what was found during the examination.  If the procedure report does not answer your questions, please call your gastroenterologist to clarify.  If you requested that your care partner not be given the details of your procedure findings, then the procedure report has been included in a sealed envelope for you to review at your convenience later.  YOU SHOULD EXPECT: Some feelings of bloating in the abdomen. Passage of more gas than usual.  Walking can help get rid of the air that was put into your GI tract during the procedure and reduce the bloating. If you had a lower endoscopy (such as a colonoscopy or flexible sigmoidoscopy) you may notice spotting of blood in your stool or on the toilet paper. If you underwent a bowel prep for your procedure, you may not have a normal bowel movement for a few days.  Please Note:  You might notice some irritation and congestion in your nose or some drainage.  This is from the oxygen used during your procedure.  There is no need for concern and it should clear up in a day or so.  SYMPTOMS TO REPORT IMMEDIATELY:   Following lower endoscopy (colonoscopy or flexible sigmoidoscopy):  Excessive amounts of blood in the stool  Significant tenderness or worsening of abdominal pains  Swelling of the abdomen that is new, acute  Fever of 100F or higher  For urgent or emergent issues, a gastroenterologist can be reached at any hour by calling [phone].   DIET:  We do recommend a small meal at first, but then you may proceed to your regular diet.  Drink plenty of fluids but you should avoid alcoholic beverages for 24 hours.  ACTIVITY:  You should plan to take it easy for the rest of today and you should NOT DRIVE or use heavy machinery until tomorrow (because of the sedation medicines used during the test).     FOLLOW UP: Our staff will call the number listed on your records the next business day following your procedure to check on you and address any questions or concerns that you may have regarding the information given to you following your procedure. If we do not reach you, we will leave a message.  However, if you are feeling well and you are not experiencing any problems, there is no need to return our call.  We will assume that you have returned to your regular daily activities without incident.  If any biopsies were taken you will be contacted by phone or by letter within the next 1-3 weeks.  Please call us at [phone] if you have not heard about the biopsies in 3 weeks.   Await for biopsy results Polys (handout given) Diverticuloses (handout given) Hemorrhoids (handout given)  SIGNATURES/CONFIDENTIALITY: You and/or your care partner have signed paperwork which will be entered into your electronic medical record.  These signatures attest to the fact that that the information above on your After Visit Summary has been reviewed and is understood.  Full responsibility of the confidentiality of this discharge information lies with you and/or your care-partner.

## 2018-09-06 NOTE — Progress Notes (Signed)
Pt's states no medical or surgical changes since previsit or office visit. 

## 2018-09-06 NOTE — Op Note (Signed)
[facility] Patient Name: Samantha Mckay Procedure Date: 09/06/2018 11:12 AM MRN: [id number] Endoscopist: Thornton Park MD, MD Age: 53 Referring MD:  Date of Birth: 1966-03-14 Gender: Female Account #: [id number] Procedure:                Colonoscopy Indications:              Screening for colorectal malignant neoplasm, This                            is the patient's first colonoscopy. No known family                            history of colon cancer or polyps. No baseline GI                            symptoms. Medicines:                See the Anesthesia note for documentation of the                            administered medications Procedure:                Pre-Anesthesia Assessment:                           - Prior to the procedure, a History and Physical                            was performed, and patient medications and                            allergies were reviewed. The patient's tolerance of                            previous anesthesia was also reviewed. The risks                            and benefits of the procedure and the sedation                            options and risks were discussed with the patient.                            All questions were answered, and informed consent                            was obtained. Prior Anticoagulants: The patient has                            taken no previous anticoagulant or antiplatelet                            agents. ASA Grade Assessment: II - A patient with  mild systemic disease. After reviewing the risks                            and benefits, the patient was deemed in                            satisfactory condition to undergo the procedure.                           After obtaining informed consent, the colonoscope                            was passed under direct vision. Throughout the                            procedure, the patient's blood pressure,  pulse, and                            oxygen saturations were monitored continuously. The                            Model CF-HQ190L [id number]) scope was introduced                            through the anus and advanced to the the terminal                            ileum, with identification of the appendiceal                            orifice and IC valve. The colonoscopy was performed                            with moderate difficulty due to a redundant colon.                            Successful completion of the procedure was aided by                            straightening and shortening the scope to obtain                            bowel loop reduction. The patient tolerated the                            procedure well. The quality of the bowel                            preparation was good. The terminal ileum, ileocecal                            valve, appendiceal orifice, and rectum were  photographed. Scope In: 11:16:51 AM Scope Out: 11:30:11 AM Scope Withdrawal Time: 0 hours 8 minutes 42 seconds  Total Procedure Duration: 0 hours 13 minutes 20 seconds  Findings:                 The perianal and digital rectal examinations were                            normal.                           Non-bleeding internal hemorrhoids were found. The                            hemorrhoids were small.                           A 2 mm polyp was found in the recto-sigmoid colon.                            The polyp was sessile. The polyp was removed with a                            piecemeal technique using a cold biopsy forceps.                            Resection and retrieval were complete. Estimated                            blood loss was minimal.                           Mild sigmoid diverticulosis present. The exam was                            otherwise without abnormality on direct and                            retroflexion  views. Complications:            No immediate complications. Estimated blood loss:                            Minimal. Estimated Blood Loss:     Estimated blood loss was minimal. Impression:               - Non-bleeding internal hemorrhoids.                           - Mild sigmoid diverticulosis.                           - One 2 mm polyp at the recto-sigmoid colon,                            removed piecemeal using a cold biopsy forceps.  Resected and retrieved.                           - The examination was otherwise normal on direct                            and retroflexion views. Recommendation:           - Patient has a contact number available for                            emergencies. The signs and symptoms of potential                            delayed complications were discussed with the                            patient. Return to normal activities tomorrow.                            Written discharge instructions were provided to the                            patient.                           - Resume previous diet. High fiber diet encouraged.                           - Continue present medications.                           - Await pathology results.                           - Repeat colonoscopy in 5 years for surveillance if                            the polyp is adenomatous. If the polyp is benign,                            continue with routine colon cancer screening. Thornton Park MD, MD 09/06/2018 11:37:34 AM This report has been signed electronically.

## 2018-09-07 ENCOUNTER — Telehealth: Payer: Self-pay

## 2018-09-07 NOTE — Telephone Encounter (Signed)
  Follow up Call-  Call back number 09/06/2018  Post procedure Call Back phone  # [phone]  Permission to leave phone message Yes  Some recent data might be hidden     Patient questions:  Do you have a fever, pain , or abdominal swelling? No. Pain Score  0 *  Have you tolerated food without any problems? Yes.    Have you been able to return to your normal activities? Yes.    Do you have any questions about your discharge instructions: Diet   No. Medications  No. Follow up visit  No.  Do you have questions or concerns about your Care? No.  Actions: * If pain score is 4 or above: No action needed, pain <4.

## 2018-09-09 ENCOUNTER — Encounter: Payer: Self-pay | Admitting: Gastroenterology

## 2019-04-20 ENCOUNTER — Telehealth: Payer: Self-pay

## 2019-04-20 NOTE — Telephone Encounter (Signed)

## 2019-04-21 ENCOUNTER — Other Ambulatory Visit: Payer: Self-pay

## 2019-04-21 ENCOUNTER — Encounter: Payer: Self-pay | Admitting: Plastic Surgery

## 2019-04-21 ENCOUNTER — Ambulatory Visit (INDEPENDENT_AMBULATORY_CARE_PROVIDER_SITE_OTHER): Payer: Self-pay | Admitting: Plastic Surgery

## 2019-04-21 DIAGNOSIS — Z719 Counseling, unspecified: Secondary | ICD-10-CM | POA: Insufficient documentation

## 2019-04-21 NOTE — Progress Notes (Signed)
Patient ID: Samantha Mckay, female    DOB: [birth year], 53 y.o.   MRN: [id number]   Chief Complaint  Patient presents with  . Advice Only    for tummy tuck    The patient is a 53 year old female here for evaluation of her abdomen.  She underwent treatment for breast cancer several years ago.  She had a mastectomy with reconstruction.  She states she is very happy with her results.  Since then she is gotten divorced from her husband.  Her 4 kids live with her.  She is 5 feet 1 inches tall.  She weighs 166 pounds.  She like to get to 155 pounds.  She is not using tobacco or nicotine.  She has not had any abdominal surgery.  The only surgery she has had otherwise is breast surgery.  She has very strong muscles and no diastases of her rectus muscles.  Her skin on the upper abdomen is pretty normal just has excess adipose.  The lower portion inferior to the umbilicus has post gestational changes as expected for 4 kids with skin laxity and wrinkling.   Review of Systems  Constitutional: Negative.  Negative for activity change and appetite change.  HENT: Negative.   Eyes: Negative.   Respiratory: Negative.   Cardiovascular: Negative.   Gastrointestinal: Negative.  Negative for abdominal pain.  Endocrine: Negative.   Genitourinary: Negative.   Musculoskeletal: Negative.   Skin: Negative.  Negative for color change and wound.  Psychiatric/Behavioral: Negative.     Past Medical History:  Diagnosis Date  . Breast cancer ([facility]) 09/22/11     right and left masectomy wirth expanders  . Breast cancer ([facility]) 09/21/11 BX   Left breast  masectomy-invasive ductal ca  . Breast cancer ([facility])    ER/PR positive  . Cancer ([place] [facility])    breast  . Liver mass     numerous simple cysts throughout hepatic parenbhyma,one 10.7cm    Past Surgical History:  Procedure Laterality Date  . AXILLARY LYMPH NODE DISSECTION  09/21/2011   Procedure: AXILLARY LYMPH NODE DISSECTION;  Surgeon: Imogene Burn. Georgette Dover, MD;   Location: [facility];  Service: General;  Laterality: Left;  . BREAST IMPLANT EXCHANGE Bilateral 11/17/2012   Procedure: BILATERAL REMOVAL OF TISSUE EXPANDER/PLACEMENT OF IMPLANTS;  Surgeon: Theodoro Kos, DO;  Location: [facility];  Service: Plastics;  Laterality: Bilateral;  . INTRAUTERINE DEVICE INSERTION    . IUD REMOVAL  02/15/2012   Procedure: INTRAUTERINE DEVICE (IUD) REMOVAL;  Surgeon: [name], MD;  Location: [facility];  Service: Gynecology;  Laterality: N/A;  . LAPAROSCOPY  02/15/2012   Procedure: LAPAROSCOPY OPERATIVE;  Surgeon: [name], MD;  Location: [facility];  Service: Gynecology;  Laterality: N/A;  . MASTECTOMY W/ SENTINEL NODE BIOPSY  09/21/2011   Procedure: MASTECTOMY WITH SENTINEL LYMPH NODE BIOPSY;  Surgeon: Imogene Burn. Georgette Dover, MD;  Location: [facility];  Service: General;  Laterality: Bilateral;  Bilateral total mastectomies with left axillary sentinel lymph node biopsy.  Marland Kitchen SALPINGOOPHORECTOMY  02/15/2012   Procedure: SALPINGO OOPHERECTOMY;  Surgeon: [name], MD;  Location: [facility];  Service: Gynecology;  Laterality: Bilateral;  . TISSUE EXPANDER PLACEMENT  09/21/2011   Procedure: TISSUE EXPANDER;  Surgeon: Theodoro Kos, DO;  Location: [facility];  Service: Plastics;  Laterality: Bilateral;  Bilateral breast reconstruction with placement of bilateral tissue expanders.      Current Outpatient Medications:  .  acetaminophen (TYLENOL) 500 MG tablet, Take by mouth., Disp: , Rfl:  .  calcium carbonate (OS-CAL) 600 MG TABS, Take 600 mg by mouth 2 (two) times daily with a meal., Disp: , Rfl:  .  cholecalciferol (VITAMIN D) 1000 UNITS tablet, Take 1,000 Units by mouth 2 (two) times daily., Disp: , Rfl:  .  ibuprofen (ADVIL,MOTRIN) 200 MG tablet, Take 200 mg by mouth every 6 (six) hours as needed. For pain, Disp: , Rfl:  .  Multiple Vitamin (MULTIVITAMIN) tablet, Take 1 tablet by mouth daily., Disp: , Rfl:   Current Facility-Administered Medications:  .  0.9 %  sodium  chloride infusion, 500 mL, Intravenous, Once, Thornton Park, MD   Objective:   Vitals:   04/21/19 1051  BP: 112/77  Pulse: 83  Temp: (!) 97.5 F (36.4 C)  SpO2: 99%    Physical Exam Vitals signs and nursing note reviewed.  Constitutional:      Appearance: Normal appearance.  HENT:     Head: Normocephalic and atraumatic.  Neck:     Musculoskeletal: Normal range of motion.  Cardiovascular:     Rate and Rhythm: Normal rate.     Pulses: Normal pulses.  Pulmonary:     Effort: Pulmonary effort is normal.  Abdominal:     General: Abdomen is flat. There is no distension.     Tenderness: There is no abdominal tenderness.  Skin:    General: Skin is warm and dry.  Neurological:     General: No focal deficit present.     Mental Status: She is alert and oriented to person, place, and time.  Psychiatric:        Mood and Affect: Mood normal.        Behavior: Behavior normal.        Thought Content: Thought content normal.     Assessment & Plan:  Encounter for counseling The patient is a good candidate for abdominoplasty with liposuction.  I have requested a quote and this will be sent to the patient. Pictures were obtained of the patient and placed in the chart with the patient's or guardian's permission.   [place], DO

## 2019-08-14 ENCOUNTER — Other Ambulatory Visit: Payer: Self-pay | Admitting: *Deleted

## 2019-08-14 DIAGNOSIS — C50412 Malignant neoplasm of upper-outer quadrant of left female breast: Secondary | ICD-10-CM

## 2019-08-14 DIAGNOSIS — Z170 Estrogen receptor positive status [ER+]: Secondary | ICD-10-CM

## 2019-08-15 ENCOUNTER — Other Ambulatory Visit: Payer: Self-pay

## 2019-08-15 ENCOUNTER — Inpatient Hospital Stay: Payer: Medicaid Other

## 2019-08-15 ENCOUNTER — Encounter: Payer: Self-pay | Admitting: Adult Health

## 2019-08-15 ENCOUNTER — Inpatient Hospital Stay: Payer: Medicaid Other | Attending: Adult Health | Admitting: Adult Health

## 2019-08-15 VITALS — BP 107/74 | HR 71 | Temp 98.2°F | Resp 18 | Ht 61.0 in | Wt 168.1 lb

## 2019-08-15 DIAGNOSIS — Z90721 Acquired absence of ovaries, unilateral: Secondary | ICD-10-CM | POA: Insufficient documentation

## 2019-08-15 DIAGNOSIS — Z79899 Other long term (current) drug therapy: Secondary | ICD-10-CM | POA: Insufficient documentation

## 2019-08-15 DIAGNOSIS — C50412 Malignant neoplasm of upper-outer quadrant of left female breast: Secondary | ICD-10-CM | POA: Insufficient documentation

## 2019-08-15 DIAGNOSIS — Z1501 Genetic susceptibility to malignant neoplasm of breast: Secondary | ICD-10-CM | POA: Insufficient documentation

## 2019-08-15 DIAGNOSIS — Z7289 Other problems related to lifestyle: Secondary | ICD-10-CM | POA: Insufficient documentation

## 2019-08-15 DIAGNOSIS — M549 Dorsalgia, unspecified: Secondary | ICD-10-CM | POA: Insufficient documentation

## 2019-08-15 DIAGNOSIS — Z170 Estrogen receptor positive status [ER+]: Secondary | ICD-10-CM

## 2019-08-15 DIAGNOSIS — Z1509 Genetic susceptibility to other malignant neoplasm: Secondary | ICD-10-CM | POA: Diagnosis not present

## 2019-08-15 DIAGNOSIS — Z800 Family history of malignant neoplasm of digestive organs: Secondary | ICD-10-CM | POA: Diagnosis not present

## 2019-08-15 DIAGNOSIS — G479 Sleep disorder, unspecified: Secondary | ICD-10-CM | POA: Insufficient documentation

## 2019-08-15 DIAGNOSIS — Z9013 Acquired absence of bilateral breasts and nipples: Secondary | ICD-10-CM | POA: Diagnosis not present

## 2019-08-15 DIAGNOSIS — Z79811 Long term (current) use of aromatase inhibitors: Secondary | ICD-10-CM | POA: Diagnosis not present

## 2019-08-15 DIAGNOSIS — Z560 Unemployment, unspecified: Secondary | ICD-10-CM | POA: Diagnosis not present

## 2019-08-15 DIAGNOSIS — G8929 Other chronic pain: Secondary | ICD-10-CM | POA: Diagnosis not present

## 2019-08-15 LAB — CBC WITH DIFFERENTIAL (CANCER CENTER ONLY)
Abs Immature Granulocytes: 0.01 10*3/uL (ref 0.00–0.07)
Basophils Absolute: 0 10*3/uL (ref 0.0–0.1)
Basophils Relative: 1 %
Eosinophils Absolute: 0.1 10*3/uL (ref 0.0–0.5)
Eosinophils Relative: 2 %
HCT: 40.9 % (ref 36.0–46.0)
Hemoglobin: 12.5 g/dL (ref 12.0–15.0)
Immature Granulocytes: 0 %
Lymphocytes Relative: 52 %
Lymphs Abs: 2.1 10*3/uL (ref 0.7–4.0)
MCH: 26.5 pg (ref 26.0–34.0)
MCHC: 30.6 g/dL (ref 30.0–36.0)
MCV: 86.8 fL (ref 80.0–100.0)
Monocytes Absolute: 0.3 10*3/uL (ref 0.1–1.0)
Monocytes Relative: 8 %
Neutro Abs: 1.5 10*3/uL — ABNORMAL LOW (ref 1.7–7.7)
Neutrophils Relative %: 37 %
Platelet Count: 221 10*3/uL (ref 150–400)
RBC: 4.71 MIL/uL (ref 3.87–5.11)
RDW: 13.2 % (ref 11.5–15.5)
WBC Count: 3.9 10*3/uL — ABNORMAL LOW (ref 4.0–10.5)
nRBC: 0 % (ref 0.0–0.2)

## 2019-08-15 LAB — CMP (CANCER CENTER ONLY)
ALT: 20 U/L (ref 0–44)
AST: 22 U/L (ref 15–41)
Albumin: 3.7 g/dL (ref 3.5–5.0)
Alkaline Phosphatase: 98 U/L (ref 38–126)
Anion gap: 8 (ref 5–15)
BUN: 12 mg/dL (ref 6–20)
CO2: 25 mmol/L (ref 22–32)
Calcium: 8.5 mg/dL — ABNORMAL LOW (ref 8.9–10.3)
Chloride: 109 mmol/L (ref 98–111)
Creatinine: 0.95 mg/dL (ref 0.44–1.00)
GFR, Est AFR Am: >60 mL/min (ref >60)
GFR, Estimated: >60 mL/min (ref >60)
Glucose, Bld: 89 mg/dL (ref 70–99)
Potassium: 4.5 mmol/L (ref 3.5–5.1)
Sodium: 142 mmol/L (ref 135–145)
Total Bilirubin: 0.4 mg/dL (ref 0.3–1.2)
Total Protein: 7.1 g/dL (ref 6.5–8.1)

## 2019-08-15 NOTE — Progress Notes (Signed)
CLINIC:  Survivorship   REASON FOR VISIT:  Routine follow-up for history of breast cancer.   BRIEF ONCOLOGIC HISTORY:  Oncology History  Breast cancer of upper-outer quadrant of left female breast ([facility])  09/21/2011 Surgery   Bilateral mastectomies: 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 Migdalia Dk  Breast cancer was ER/PR positive and Her-2 equivical (couldn't do fish due to lack of cells)   02/15/2012 Surgery   Bilateral salpingo-oophorectomy because she was BRCA2 mutation positive   03/27/2012 - 06/03/2017 Anti-estrogen oral therapy   Arimidex 1 mg daily      INTERVAL HISTORY:  Ms. Macari presents to the [facility] today for routine follow-up for her history of breast cancer.  Overall, she reports feeling quite well.   Fatemah notes that this year has been a good year.  She did get laid off from her job, but has remained positive despite this.  She has enjoyed the time off she has spent with her children, and her one year old granddaughter during the day.  She notes that she has undergone colonoscopy and xrays.  She continues to have some chronic back pain and difficulty sleeping.  She is not regularly exercising.  I placed orders for her to undergo MRCP secondary to her BRCA 2 positivity and fh of pancreatic cancer, however she has not yet had this done.    REVIEW OF SYSTEMS:  Review of Systems  Constitutional: Negative for appetite change, chills, fatigue and unexpected weight change.  HENT:   Negative for hearing loss, lump/mass and mouth sores.   Eyes: Negative for eye problems and icterus.  Respiratory: Negative for chest tightness, cough and shortness of breath.   Cardiovascular: Negative for chest pain, leg swelling and palpitations.  Gastrointestinal: Negative for abdominal distention, abdominal pain, constipation, diarrhea, nausea and vomiting.   Endocrine: Negative for hot flashes.  Genitourinary: Negative for difficulty urinating.   Musculoskeletal: Negative for arthralgias.  Skin: Negative for itching and rash.  Neurological: Negative for dizziness, extremity weakness, headaches and numbness.  Hematological: Negative for adenopathy. Does not bruise/bleed easily.  Psychiatric/Behavioral: Negative for depression. The patient is not nervous/anxious.   Breast: Denies any new nodularity, masses, tenderness, nipple changes, or nipple discharge.       PAST MEDICAL/SURGICAL HISTORY:  Past Medical History:  Diagnosis Date  . Breast cancer ([facility]) 09/22/11     right and left masectomy wirth expanders  . Breast cancer ([facility]) 09/21/11 BX   Left breast  masectomy-invasive ductal ca  . Breast cancer ([facility])    ER/PR positive  . Cancer ([facility])    breast  . Liver mass     numerous simple cysts throughout hepatic parenbhyma,one 10.7cm   Past Surgical History:  Procedure Laterality Date  . AXILLARY LYMPH NODE DISSECTION  09/21/2011   Procedure: AXILLARY LYMPH NODE DISSECTION;  Surgeon: Imogene Burn. Georgette Dover, MD;  Location: [facility];  Service: General;  Laterality: Left;  . BREAST IMPLANT EXCHANGE Bilateral 11/17/2012   Procedure: BILATERAL REMOVAL OF TISSUE EXPANDER/PLACEMENT OF IMPLANTS;  Surgeon: Theodoro Kos, DO;  Location: [facility];  Service: Plastics;  Laterality: Bilateral;  . INTRAUTERINE DEVICE INSERTION    . IUD REMOVAL  02/15/2012   Procedure: INTRAUTERINE DEVICE (IUD) REMOVAL;  Surgeon: [name], MD;  Location: [facility];  Service: Gynecology;  Laterality: N/A;  . LAPAROSCOPY  02/15/2012   Procedure: LAPAROSCOPY OPERATIVE;  Surgeon: [name], MD;  Location:  [facility];  Service: Gynecology;  Laterality: N/A;  . MASTECTOMY W/ SENTINEL NODE BIOPSY  09/21/2011   Procedure: MASTECTOMY WITH SENTINEL LYMPH NODE BIOPSY;  Surgeon: Imogene Burn. Georgette Dover, MD;  Location: [facility];  Service: General;  Laterality: Bilateral;  Bilateral  total mastectomies with left axillary sentinel lymph node biopsy.  Marland Kitchen SALPINGOOPHORECTOMY  02/15/2012   Procedure: SALPINGO OOPHERECTOMY;  Surgeon: [name], MD;  Location: [facility];  Service: Gynecology;  Laterality: Bilateral;  . TISSUE EXPANDER PLACEMENT  09/21/2011   Procedure: TISSUE EXPANDER;  Surgeon: Theodoro Kos, DO;  Location: [facility];  Service: Plastics;  Laterality: Bilateral;  Bilateral breast reconstruction with placement of bilateral tissue expanders.     ALLERGIES:  No Known Allergies   CURRENT MEDICATIONS:  Outpatient Encounter Medications as of 08/15/2019  Medication Sig Note  . acetaminophen (TYLENOL) 500 MG tablet Take by mouth. 04/09/2015: Received from: [facility]  . calcium carbonate (OS-CAL) 600 MG TABS Take 600 mg by mouth 2 (two) times daily with a meal.   . cholecalciferol (VITAMIN D) 1000 UNITS tablet Take 1,000 Units by mouth 2 (two) times daily.   . Multiple Vitamin (MULTIVITAMIN) tablet Take 1 tablet by mouth daily.   . [DISCONTINUED] ibuprofen (ADVIL,MOTRIN) 200 MG tablet Take 200 mg by mouth every 6 (six) hours as needed. For pain    Facility-Administered Encounter Medications as of 08/15/2019  Medication  . 0.9 %  sodium chloride infusion     ONCOLOGIC FAMILY HISTORY:  Family History  Problem Relation Age of Onset  . Cancer Mother        stomach  . Colon cancer Mother   . Anesthesia problems Neg Hx   . Hypotension Neg Hx   . Malignant hyperthermia Neg Hx   . Pseudochol deficiency Neg Hx   . Colon polyps Neg Hx   . Esophageal cancer Neg Hx   . Rectal cancer Neg Hx   . Stomach cancer Neg Hx     GENETIC COUNSELING/TESTING: BRCA 2 positive  SOCIAL HISTORY:  Social History   Socioeconomic History  . Marital status: Married    Spouse name: Not on file  . Number of children: Not on file  . Years of education: Not on file  . Highest education level: Not on file  Occupational History  . Not on file  Tobacco Use  . Smoking status:  Never Smoker  . Smokeless tobacco: Never Used  . Tobacco comment: occ glass of wine  Substance and Sexual Activity  . Alcohol use: Yes    Alcohol/week: 2.0 standard drinks    Types: 2 Glasses of wine per week  . Drug use: No  . Sexual activity: Yes    Birth control/protection: I.U.D.    Comment: menarche age 10,1st preg.,age 4,hormones-yes  Other Topics Concern  . Not on file  Social History Narrative  . Not on file   Social Determinants of Health   Financial Resource Strain:   . Difficulty of Paying Living Expenses: Not on file  Food Insecurity:   . Worried About Charity fundraiser in the Last Year: Not on file  . Ran Out of Food in the Last Year: Not on file  Transportation Needs:   . Lack of Transportation (Medical): Not on file  . Lack of Transportation (Non-Medical): Not on file  Physical Activity:   . Days of Exercise per Week: Not on file  . Minutes of Exercise per Session: Not on file  Stress:   . Feeling  of Stress : Not on file  Social Connections:   . Frequency of Communication with Friends and Family: Not on file  . Frequency of Social Gatherings with Friends and Family: Not on file  . Attends Religious Services: Not on file  . Active Member of Clubs or Organizations: Not on file  . Attends Archivist Meetings: Not on file  . Marital Status: Not on file  Intimate Partner Violence:   . Fear of Current or Ex-Partner: Not on file  . Emotionally Abused: Not on file  . Physically Abused: Not on file  . Sexually Abused: Not on file     PHYSICAL EXAMINATION:  Vital Signs: Vitals:   08/15/19 1516  BP: 107/74  Pulse: 71  Resp: 18  Temp: 98.2 F (36.8 C)  SpO2: 100%   Filed Weights   08/15/19 1516  Weight: 168 lb 1.6 oz (76.2 kg)   General: Well-nourished, well-appearing female in no acute distress.  Unaccompanied today.   HEENT: Head is normocephalic.  Pupils equal and reactive to light. Conjunctivae clear without exudate.  Sclerae  anicteric. Mask in place  Lymph: No cervical, supraclavicular, or infraclavicular lymphadenopathy noted on palpation.  Cardiovascular: Regular rate and rhythm.Marland Kitchen Respiratory: Clear to auscultation bilaterally. Chest expansion symmetric; breathing non-labored.  Breast Exam:  Breast: s/p bilateral mastectomies and reconstruction, no sign of local recurrence noted -Axilla: No axillary adenopathy bilaterally.  GI: Abdomen soft and round; non-tender, non-distended. Bowel sounds normoactive. No hepatosplenomegaly.   GU: Deferred.  Neuro: No focal deficits. Steady gait.  Psych: Mood and affect normal and appropriate for situation.  MSK: No focal spinal tenderness to palpation, full range of motion in bilateral upper extremities Extremities: No edema. Skin: Warm and dry.  LABORATORY DATA:  None for this visit   DIAGNOSTIC IMAGING:     ASSESSMENT AND PLAN:  Ms.. Zahm is a pleasant 54 y.o. female with history of Stage IIA left breast invasive ductal carcinoma, ER+/PR+/HER2-, diagnosed in 2013, treated with bilateral mastectomies, and anti-estrogen therapy with Anastrozole x 5 years completing therapy in 05/2017.  She [facility] for surveillance and routine follow-up.   1. History of breast cancer:  Ms. Gassner is  Doing well and is without evidence of disease or recurrence of breast cancer.  She will return in one year for labs and LTS follow up.  I encouraged her to call me with any questions or concerns before her next visit at the cancer center, and I would be happy to see her sooner, if needed.    2. BRCA2 positivity: I placed order for MRCP today considering her first degree relative with pancreatic cancer.  She plans on getting that done this year.  3. Bone health:  Given Ms. Suddeth's age, history of breast cancer, and her previous anti-estrogen therapy with Anastrozole, she is at risk for bone demineralization. Her last DEXA was in 2015 and was normal.   She was given education on specific food and activities to promote bone health.  4. Cancer screening:  Due to Ms. Detienne's history and her age, she should receive screening for skin cancers, colon cancer, and pancreatic cancer.   5. Health maintenance and wellness promotion: Ms. Koenig was encouraged to consume 5-7 servings of fruits and vegetables per day. She was also encouraged to engage in moderate to vigorous exercise for 30 minutes per day most days of the week. She was instructed to limit her alcohol consumption and continue to abstain from tobacco use.  6. Back pain and sleep pattern disturbance: Has been imaged previously, chronic.  I recommended exercise and yoga to help with these, to call if doesn't improve.    Dispo:  -Return to cancer center in one year for LTS follow up -MRCP   Total time this encounter: 30 minutes  [name], NP [place] [phone]   Note: PRIMARY CARE PROVIDER Wilber Bihari [place], [place] [phone]

## 2019-08-16 ENCOUNTER — Telehealth: Payer: Self-pay | Admitting: Adult Health

## 2019-08-16 NOTE — Telephone Encounter (Signed)
I left a message regarding schedule  

## 2019-08-29 ENCOUNTER — Other Ambulatory Visit: Payer: Self-pay

## 2019-08-29 ENCOUNTER — Telehealth: Payer: Self-pay

## 2019-08-29 ENCOUNTER — Ambulatory Visit (HOSPITAL_COMMUNITY)
Admission: RE | Admit: 2019-08-29 | Discharge: 2019-08-29 | Disposition: A | Discharge status: Home / Self Care | Payer: Medicaid Other | Source: Ambulatory Visit | Attending: Adult Health | Admitting: Adult Health

## 2019-08-29 ENCOUNTER — Other Ambulatory Visit: Payer: Self-pay | Admitting: Adult Health

## 2019-08-29 ENCOUNTER — Encounter (HOSPITAL_COMMUNITY): Payer: Self-pay

## 2019-08-29 DIAGNOSIS — Z1509 Genetic susceptibility to other malignant neoplasm: Secondary | ICD-10-CM | POA: Insufficient documentation

## 2019-08-29 DIAGNOSIS — Z800 Family history of malignant neoplasm of digestive organs: Secondary | ICD-10-CM | POA: Diagnosis present

## 2019-08-29 DIAGNOSIS — Z170 Estrogen receptor positive status [ER+]: Secondary | ICD-10-CM | POA: Diagnosis present

## 2019-08-29 DIAGNOSIS — Z1501 Genetic susceptibility to malignant neoplasm of breast: Secondary | ICD-10-CM

## 2019-08-29 DIAGNOSIS — C50412 Malignant neoplasm of upper-outer quadrant of left female breast: Secondary | ICD-10-CM | POA: Insufficient documentation

## 2019-08-29 MED ORDER — GADOBUTROL 1 MMOL/ML IV SOLN
7.5000 mL | Freq: Once | INTRAVENOUS | Status: AC | PRN
  Administered 2019-08-29: 7.5 mL via INTRAVENOUS

## 2019-08-29 NOTE — Telephone Encounter (Signed)
-----   Message from [name], NP sent at 08/29/2019  3:58 PM EST ----- MRI normal, no cancer, please notify patient ----- Message ----- From: Interface, Rad Results In Sent: 08/29/2019   3:52 PM EST To: [name], NP

## 2019-08-29 NOTE — Telephone Encounter (Signed)
Spoke with patient to inform of normal MRI results, no cancer, Per [facility]/NP.  Patient voiced understanding and thanks for call.

## 2019-10-13 MED FILL — AZELASTINE HCL 137 MCG SPRY: 0.1 | 30 days supply | Qty: 30 | Fill #0

## 2019-12-22 ENCOUNTER — Telehealth: Payer: Self-pay

## 2019-12-22 NOTE — Telephone Encounter (Signed)
Patient  left a message wanting to know about paperwork that was requested and wanting to know when it would be sent. Sent in basket message to Roslaind RN and let her know about patients question and also let her knoe that Her number is [phone]

## 2019-12-27 ENCOUNTER — Telehealth: Payer: Self-pay | Admitting: *Deleted

## 2019-12-27 NOTE — Telephone Encounter (Signed)
-----   Message from Will Bonnet, LPN sent at [phone]  2:58 PM EDT ----- Regarding: Paperwork Patient called and left a message wanting to know about paperwork that was requested and wanting to know when it would be sent. Her number is [phone]

## 2019-12-27 NOTE — Telephone Encounter (Signed)
Call mis-routed to FMLA/[employer] forms nurse for [employer]  Connected with Haruko Hern [phone]) to notify her no form received.   Denies need for form completion.  "I need my records from when I first started to show I am healthy for an insurance policy.     Have been trying for several months; this is third company, I have ten days to obtain medical records from 2013 to present.  No one in the office will return their calls or send my information."   Shared [employer] regulations with instructions to come in early tomorrow, show ID and sign to obtain records per [facility] [employer]   "I will come in tomorrow, sign a release and [employer] can call me to return to pick up my records from the office."

## 2020-01-02 ENCOUNTER — Telehealth: Payer: Self-pay | Admitting: Hematology and Oncology

## 2020-01-02 NOTE — Telephone Encounter (Signed)
Release [id number] Printed medical records for patient request.

## 2020-02-29 IMAGING — MR MR ABDOMEN WO/W CM MRCP
12 of 24 series · 21 of 48 positions shown · IV contrast (gadavist)
Comparison: 08/12/2011

CLINICAL DATA: Family history of pancreatic carcinoma. [id number]
positive. Left breast carcinoma.

EXAM:
MRI ABDOMEN WITHOUT AND WITH CONTRAST (INCLUDING MRCP)
TECHNIQUE: Multiplanar multisequence MR imaging of the abdomen was performed
both before and after the administration of intravenous contrast.
Heavily T2-weighted images of the biliary and pancreatic ducts were
obtained, and three-dimensional MRCP images were rendered by post
processing.
CONTRAST:  7.5mL GADAVIST GADOBUTROL 1 MMOL/ML IV SOLN

[Series 4: T2 fat-sat · axial · 5.0mm · 0.78mm/px · z∈[-78,+212]mm · 3 of 59 slices shown]
[im 1/59]
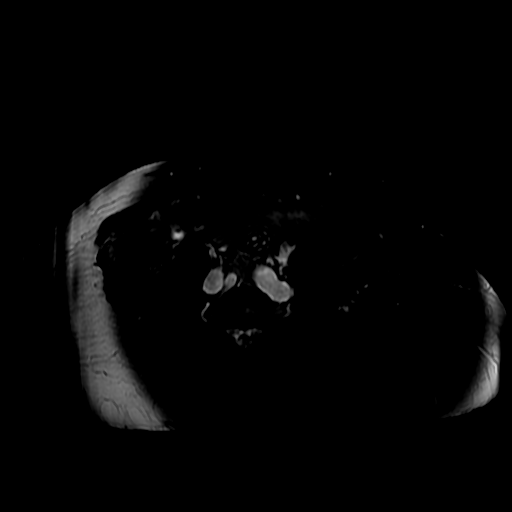
[im 30/59]
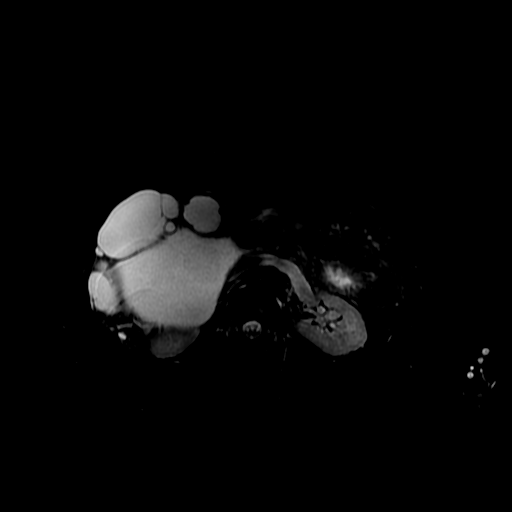
[im 59/59]
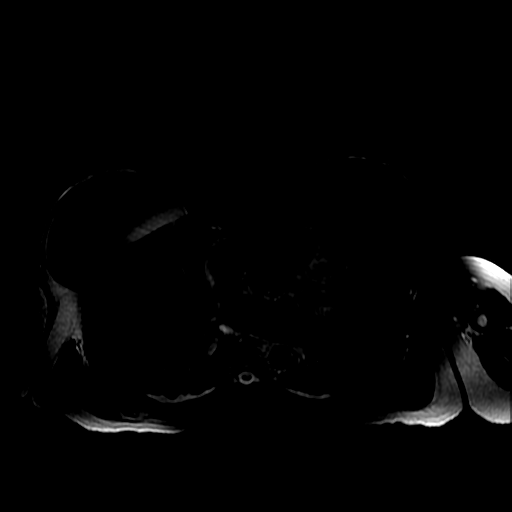

[Series 5: DWI b500 · axial · 6.0mm · 1.48mm/px · z∈[-37,+213]mm · 2 of 66 slices shown]
[im 1/66]
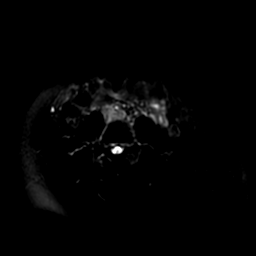
[im 66/66]
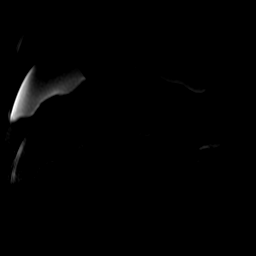

[Series 6: T2 · axial · 5.0mm · 0.78mm/px · z∈[-72,+208]mm · 2 of 57 slices shown (1 of 3)]
[im 1/57]
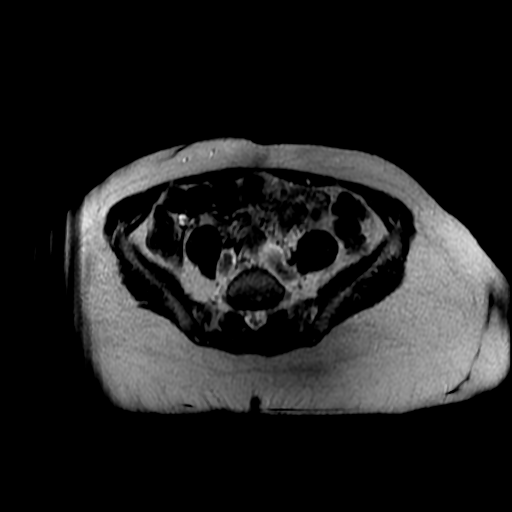
[im 57/57]
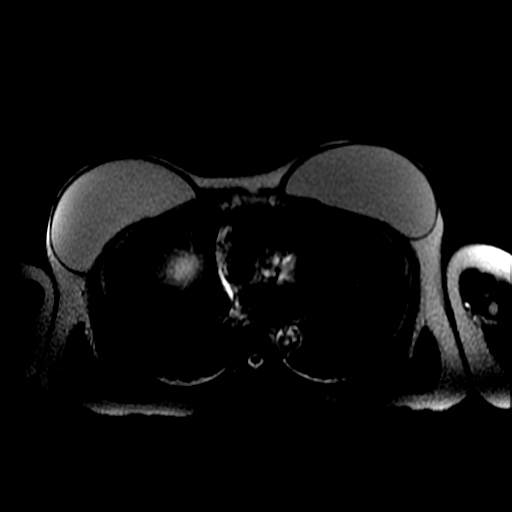

[Series 7: T2 · sagittal · 5.0mm · 0.78mm/px · 1 of 52 slices shown (2 of 3)]
[im 1/52]
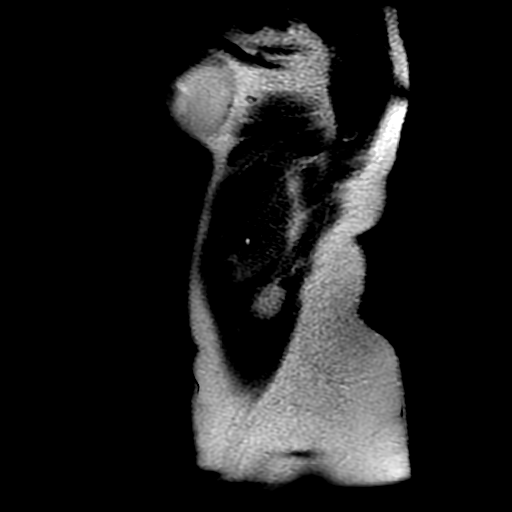

[Series 8: T2 · coronal · 5.0mm · 0.78mm/px · 1 of 34 slices shown (3 of 3)]
[im 1/34]
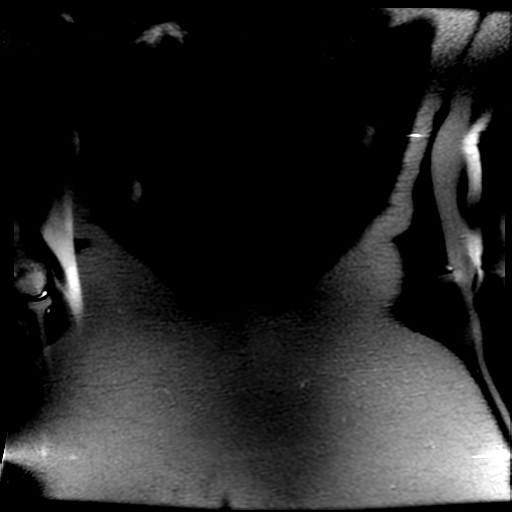

[Series 11: bSSFP · coronal · 5.0mm · 0.78mm/px · 1 of 33 slices shown]
[im 1/33]
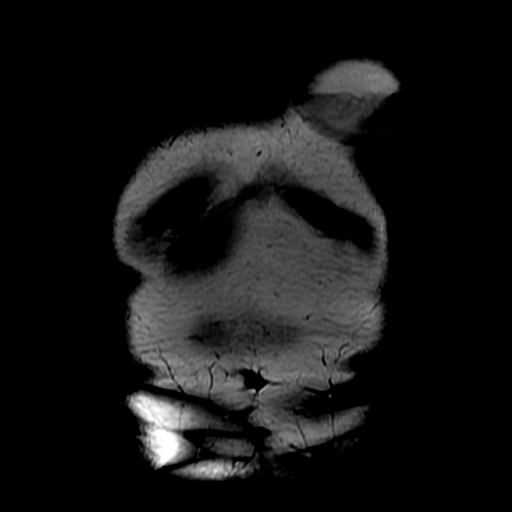

[Series 12: ax dualecho bh · axial · 5.0mm · 0.78mm/px · z∈[-71,+209]mm · 3 of 114 slices shown]
[im 1/114]
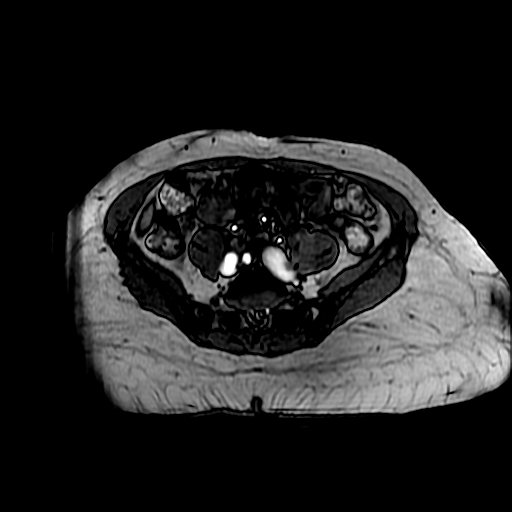
[im 57/114]
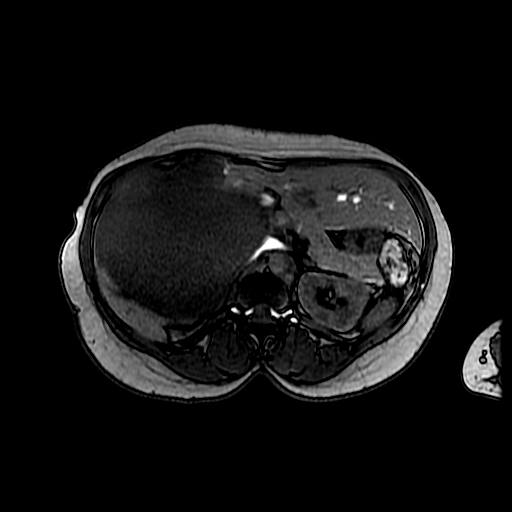
[im 114/114]
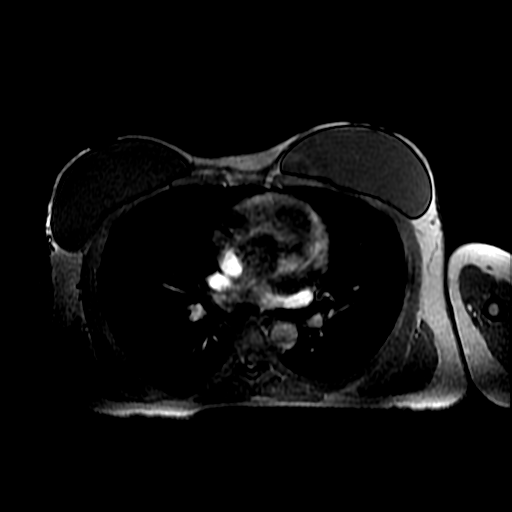

[Series 500: DWI · axial · 6.0mm · 1.48mm/px · 1 of 33 slices shown]
[im 1/33]
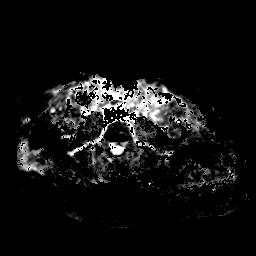

[Series 1000: pjn · axial · 1.6mm · 0.39mm/px · 1 of 1 slices shown (1 of 4)]
[im 1/1]
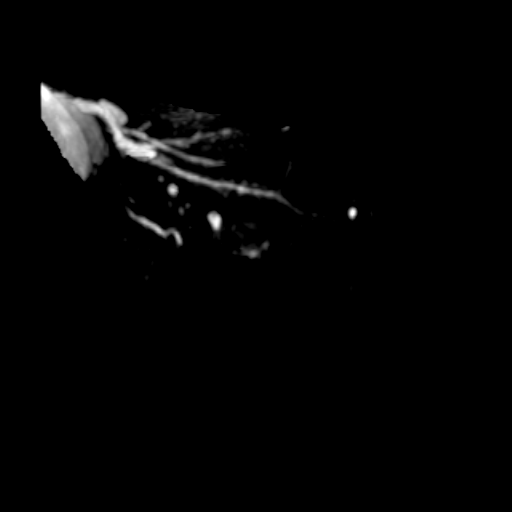

[Series 1001: pjn · coronal · 1.6mm · 0.39mm/px · 1 of 4 slices shown (2 of 4)]
[im 1/4]
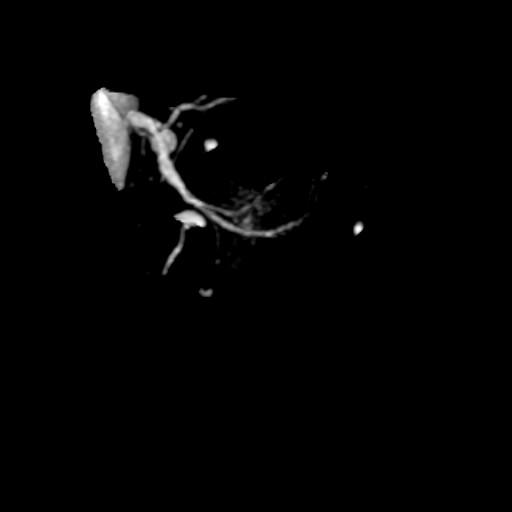

[Series 1003: pjn · axial · 0.6mm · 0.62mm/px · z∈[+67,+180]mm · 3 of 116 slices shown (3 of 4)]
[im 1/116]
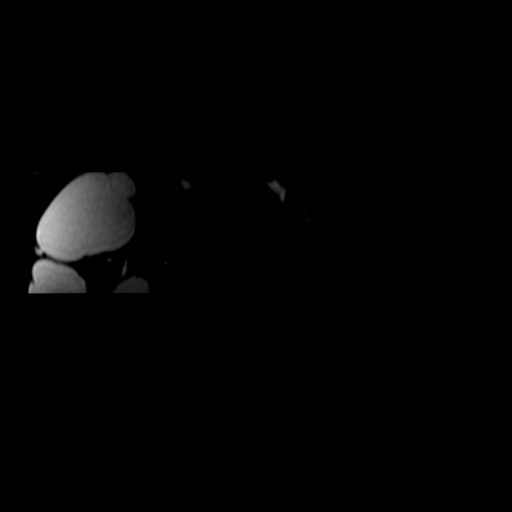
[im 58/116]
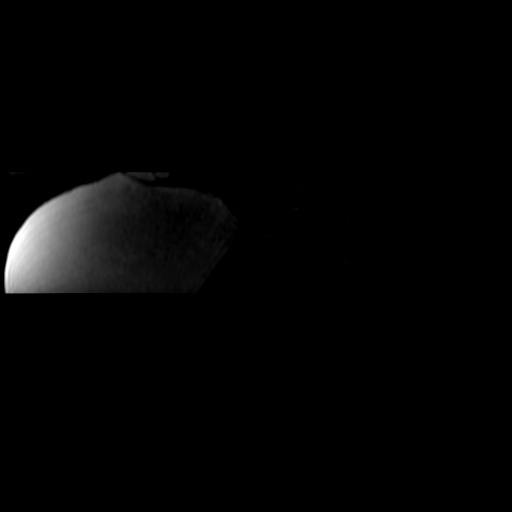
[im 116/116]
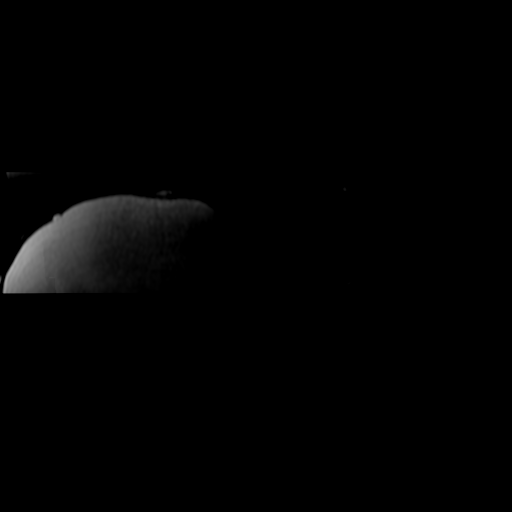

[Series 1004: pjn · sagittal · 0.6mm · 0.62mm/px · 2 of 147 slices shown (4 of 4)]
[im 1/147]
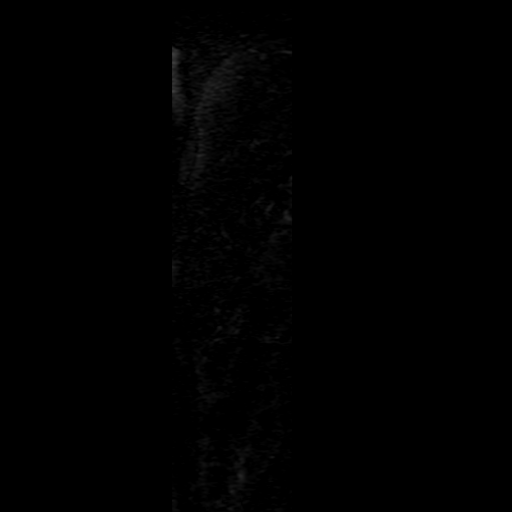
[im 49/147]
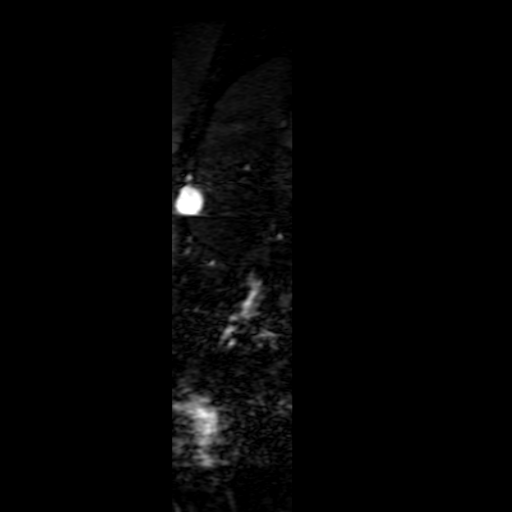

[21 of 48 positions shown; findings below may reference images not displayed]

FINDINGS: Lower chest: No acute findings.

Hepatobiliary: Multiple cysts are again seen throughout the right
and left hepatic lobes, largest measuring 15.6 cm. No solid hepatic
masses are identified. Gallbladder is unremarkable. No evidence of
biliary ductal dilatation or choledocholithiasisa.

Pancreas: No evidence of pancreatic mass or pancreatic ductal
dilatation. No evidence of peripancreatic inflammatory changes or
fluid collections. No evidence of pancreas divisum.

Spleen:  Within normal limits in size and appearance.

Adrenals/Urinary Tract: No masses identified. No evidence of
hydronephrosis.

Stomach/Bowel: Visualized portion unremarkable.

Vascular/Lymphatic: No pathologically enlarged lymph nodes
identified. No abdominal aortic aneurysm.

Other:  None.

Musculoskeletal:  No suspicious bone lesions identified.
IMPRESSION: 1. No acute findings. No evidence of pancreatic mass or abdominal
metastatic disease.
2. Numerous hepatic cysts.  No evidence of hepatic neoplasm.

## 2020-08-22 ENCOUNTER — Encounter: Payer: Self-pay | Admitting: Adult Health

## 2020-08-22 ENCOUNTER — Inpatient Hospital Stay: Payer: Medicaid Other | Attending: Adult Health | Admitting: Adult Health

## 2020-08-22 ENCOUNTER — Other Ambulatory Visit: Payer: Self-pay

## 2020-08-22 VITALS — BP 114/77 | HR 67 | Temp 97.7°F | Resp 20 | Ht 61.0 in | Wt 177.5 lb

## 2020-08-22 DIAGNOSIS — Z79899 Other long term (current) drug therapy: Secondary | ICD-10-CM | POA: Insufficient documentation

## 2020-08-22 DIAGNOSIS — Z800 Family history of malignant neoplasm of digestive organs: Secondary | ICD-10-CM | POA: Diagnosis not present

## 2020-08-22 DIAGNOSIS — C50412 Malignant neoplasm of upper-outer quadrant of left female breast: Secondary | ICD-10-CM | POA: Diagnosis not present

## 2020-08-22 DIAGNOSIS — Z170 Estrogen receptor positive status [ER+]: Secondary | ICD-10-CM | POA: Insufficient documentation

## 2020-08-22 DIAGNOSIS — Z9013 Acquired absence of bilateral breasts and nipples: Secondary | ICD-10-CM | POA: Insufficient documentation

## 2020-08-22 DIAGNOSIS — Z79811 Long term (current) use of aromatase inhibitors: Secondary | ICD-10-CM | POA: Insufficient documentation

## 2020-08-22 DIAGNOSIS — Z90721 Acquired absence of ovaries, unilateral: Secondary | ICD-10-CM | POA: Insufficient documentation

## 2020-08-22 NOTE — Progress Notes (Signed)
CLINIC:  Survivorship   REASON FOR VISIT:  Routine follow-up for history of breast cancer.   BRIEF ONCOLOGIC HISTORY:  Oncology History  Breast cancer of upper-outer quadrant of left female breast ([place])  09/21/2011 Surgery   Bilateral mastectomies: 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 Migdalia Dk  Breast cancer was ER/PR positive and Her-2 equivical (couldn't do fish due to lack of cells)   02/15/2012 Surgery   Bilateral salpingo-oophorectomy because she was BRCA2 mutation positive   03/27/2012 - 06/03/2017 Anti-estrogen oral therapy   Arimidex 1 mg daily      INTERVAL HISTORY:  Ms. Rill presents to the [facility] today for routine follow-up for her history of breast cancer.  Overall, she reports feeling quite well.   She underwent MRCP on 08/29/2019 and it showed no acute findings, pancreatic mass, or abdominal metastatic disease.  Numerous hepatic cysts were noted, however helpatic neoplasm was ruled out.  She denies any new family history.  She is not exercising and wants to work on this and losing some weight.  She notes she is overdue for PCP visit, that she has delayed due to [place]:  Review of Systems  Constitutional: Negative for appetite change, chills, fatigue and unexpected weight change.  HENT:   Negative for hearing loss, lump/mass and mouth sores.   Eyes: Negative for eye problems and icterus.  Respiratory: Negative for chest tightness, cough and shortness of breath.   Cardiovascular: Negative for chest pain, leg swelling and palpitations.  Gastrointestinal: Negative for abdominal distention, abdominal pain, constipation, diarrhea, nausea and vomiting.  Endocrine: Negative for hot flashes.  Genitourinary: Negative for difficulty urinating.   Musculoskeletal: Negative for arthralgias.  Skin: Negative for itching and  rash.  Neurological: Negative for dizziness, extremity weakness, headaches and numbness.  Hematological: Negative for adenopathy. Does not bruise/bleed easily.  Psychiatric/Behavioral: Negative for depression. The patient is not nervous/anxious.   Breast: Denies any new nodularity, masses, tenderness, nipple changes, or nipple discharge.       PAST MEDICAL/SURGICAL HISTORY:  Past Medical History:  Diagnosis Date  . Breast cancer ([place]) 09/22/11     right and left masectomy wirth expanders  . Breast cancer ([place]) 09/21/11 BX   Left breast  masectomy-invasive ductal ca  . Breast cancer ([place])    ER/PR positive  . Cancer ([place])    breast  . Liver mass     numerous simple cysts throughout hepatic parenbhyma,one 10.7cm   Past Surgical History:  Procedure Laterality Date  . AXILLARY LYMPH NODE DISSECTION  09/21/2011   Procedure: AXILLARY LYMPH NODE DISSECTION;  Surgeon: Imogene Burn. Georgette Dover, MD;  Location: [facility];  Service: General;  Laterality: Left;  . BREAST IMPLANT EXCHANGE Bilateral 11/17/2012   Procedure: BILATERAL REMOVAL OF TISSUE EXPANDER/PLACEMENT OF IMPLANTS;  Surgeon: Theodoro Kos, DO;  Location: [facility];  Service: Plastics;  Laterality: Bilateral;  . INTRAUTERINE DEVICE INSERTION    . IUD REMOVAL  02/15/2012   Procedure: INTRAUTERINE DEVICE (IUD) REMOVAL;  Surgeon: [name], MD;  Location: [facility];  Service: Gynecology;  Laterality: N/A;  . LAPAROSCOPY  02/15/2012   Procedure: LAPAROSCOPY OPERATIVE;  Surgeon: [name], MD;  Location: [facility];  Service: Gynecology;  Laterality: N/A;  . MASTECTOMY W/ SENTINEL NODE BIOPSY  09/21/2011   Procedure: MASTECTOMY WITH SENTINEL LYMPH NODE BIOPSY;  Surgeon: Imogene Burn. Georgette Dover, MD;  Location: [facility];  Service: General;  Laterality: Bilateral;  Bilateral total mastectomies with left axillary sentinel lymph node biopsy.  Marland Kitchen SALPINGOOPHORECTOMY  02/15/2012   Procedure: SALPINGO OOPHERECTOMY;  Surgeon: [name], MD;   Location: [facility];  Service: Gynecology;  Laterality: Bilateral;  . TISSUE EXPANDER PLACEMENT  09/21/2011   Procedure: TISSUE EXPANDER;  Surgeon: Theodoro Kos, DO;  Location: [place] [facility];  Service: Plastics;  Laterality: Bilateral;  Bilateral breast reconstruction with placement of bilateral tissue expanders.     ALLERGIES:  No Known Allergies   CURRENT MEDICATIONS:  Outpatient Encounter Medications as of 08/22/2020  Medication Sig Note  . acetaminophen (TYLENOL) 500 MG tablet Take by mouth. 04/09/2015: [facility] from: [facility]  . calcium carbonate (OS-CAL) 600 MG TABS Take 600 mg by mouth 2 (two) times daily with a meal.   . cholecalciferol (VITAMIN D) 1000 UNITS tablet Take 1,000 Units by mouth 2 (two) times daily.   . Multiple Vitamin (MULTIVITAMIN) tablet Take 1 tablet by mouth daily.    Facility-Administered Encounter Medications as of 08/22/2020  Medication  . 0.9 %  sodium chloride infusion     ONCOLOGIC FAMILY HISTORY:  Family History  Problem Relation Age of Onset  . Cancer Mother        stomach  . Colon cancer Mother   . Anesthesia problems Neg Hx   . Hypotension Neg Hx   . Malignant hyperthermia Neg Hx   . Pseudochol deficiency Neg Hx   . Colon polyps Neg Hx   . Esophageal cancer Neg Hx   . Rectal cancer Neg Hx   . Stomach cancer Neg Hx     GENETIC COUNSELING/TESTING: BRCA 2 positive  SOCIAL HISTORY:  Social History   Socioeconomic History  . Marital status: Married    Spouse name: Not on file  . Number of children: Not on file  . Years of education: Not on file  . Highest education level: Not on file  Occupational History  . Not on file  Tobacco Use  . Smoking status: Never Smoker  . Smokeless tobacco: Never Used  . Tobacco comment: occ glass of wine  Vaping Use  . Vaping Use: Never used  Substance and Sexual Activity  . Alcohol use: Yes    Alcohol/week: 2.0 standard drinks    Types: 2 Glasses of wine per week  . Drug use: No  . Sexual  activity: Yes    Birth control/protection: I.U.D.    Comment: menarche age 89,1st preg.,age 45,hormones-yes  Other Topics Concern  . Not on file  Social History Narrative  . Not on file   Social Determinants of Health   Financial Resource Strain: Not on file  Food Insecurity: Not on file  Transportation Needs: Not on file  Physical Activity: Not on file  Stress: Not on file  Social Connections: Not on file  Intimate Partner Violence: Not on file     PHYSICAL EXAMINATION:  Vital Signs: Vitals:   08/22/20 1130  BP: 114/77  Pulse: 67  Resp: 20  Temp: 97.7 F (36.5 C)  SpO2: 100%   Filed Weights   08/22/20 1130  Weight: 177 lb 8 oz (80.5 kg)   General: Well-nourished, well-appearing female in no acute distress.  Unaccompanied today.   HEENT: Head is normocephalic.  Pupils equal and reactive to light. Conjunctivae clear without exudate.  Sclerae anicteric. Mask in place  Lymph: No cervical, supraclavicular, or infraclavicular lymphadenopathy noted on palpation.  Cardiovascular: Regular rate and rhythm.Marland Kitchen Respiratory: Clear to auscultation bilaterally.  Chest expansion symmetric; breathing non-labored.  Breast Exam:  Breast: s/p bilateral mastectomies and reconstruction, no sign of local recurrence noted -Axilla: No axillary adenopathy bilaterally.  GI: Abdomen soft and round; non-tender, non-distended. Bowel sounds normoactive. No hepatosplenomegaly.   GU: Deferred.  Neuro: No focal deficits. Steady gait.  Psych: Mood and affect normal and appropriate for situation.  MSK: No focal spinal tenderness to palpation, full range of motion in bilateral upper extremities Extremities: No edema. Skin: Warm and dry.  LABORATORY DATA:  None for this visit   DIAGNOSTIC IMAGING:     ASSESSMENT AND PLAN:  Ms.. Morlock is a pleasant 55 y.o. female with history of Stage IIA left breast invasive ductal carcinoma, ER+/PR+/HER2-, diagnosed in 2013, treated with bilateral  mastectomies, and anti-estrogen therapy with Anastrozole x 5 years completing therapy in 05/2017.  [facility] for surveillance and routine follow-up.   1. History of breast cancer:  Ms. Roulhac is  Doing well and is without evidence of disease or recurrence of breast cancer.  She will return in one year for labs and LTS follow up.  I encouraged her to call me with any questions or concerns before her next visit at the cancer center, and I would be happy to see her sooner, if needed.    2. BRCA2 positivity: Last years MRCP results are normal.  We will get one of these every other year.    3. Bone health:  Given Ms. Stiehl's age, history of breast cancer, and her previous anti-estrogen therapy with Anastrozole, she is at risk for bone demineralization. Her last DEXA was in 2015 and was normal.  She was given education on specific food and activities to promote bone health.  4. Cancer screening:  Due to Ms. Palen's history and her age, she should receive screening for skin cancers, colon cancer, and pancreatic cancer.   5. Health maintenance and wellness promotion: Ms. Medel was encouraged to consume 5-7 servings of fruits and vegetables per day. She was also encouraged to engage in moderate to vigorous exercise for 30 minutes per day most days of the week. She was instructed to limit her alcohol consumption and continue to abstain from tobacco use.   Dispo:  -Return to cancer center in one year for LTS follow up -MRCP in 08/2021  Total encounter time: 20 minutes*  Wilber Bihari, NP 08/22/20 12:58 PM Medical Oncology and Hematology [facility] [place], [place] [address] Tel. [phone]    Fax. [phone]  *Total Encounter Time as defined by the Centers for Medicare and Medicaid Services includes, in addition to the face-to-face time of a patient visit (documented in the note above) non-face-to-face time: obtaining  and reviewing outside history, ordering and reviewing medications, tests or procedures, care coordination (communications with other health care professionals or caregivers) and documentation in the medical record.    Note: PRIMARY CARE PROVIDER Wilber Bihari [place], [place] [phone]

## 2020-08-27 ENCOUNTER — Telehealth: Payer: Self-pay | Admitting: Adult Health

## 2020-08-27 NOTE — Telephone Encounter (Signed)
No 12/7 los. No changes made to pt's schedule.  

## 2021-04-10 ENCOUNTER — Other Ambulatory Visit: Payer: Self-pay

## 2021-04-10 ENCOUNTER — Inpatient Hospital Stay: Payer: Medicaid Other | Attending: Adult Health | Admitting: Adult Health

## 2021-04-10 VITALS — BP 118/76 | HR 65 | Temp 97.5°F | Resp 20 | Wt 173.0 lb

## 2021-04-10 DIAGNOSIS — Z800 Family history of malignant neoplasm of digestive organs: Secondary | ICD-10-CM | POA: Diagnosis not present

## 2021-04-10 DIAGNOSIS — Z90721 Acquired absence of ovaries, unilateral: Secondary | ICD-10-CM | POA: Diagnosis not present

## 2021-04-10 DIAGNOSIS — Z9013 Acquired absence of bilateral breasts and nipples: Secondary | ICD-10-CM | POA: Diagnosis not present

## 2021-04-10 DIAGNOSIS — Z1509 Genetic susceptibility to other malignant neoplasm: Secondary | ICD-10-CM | POA: Insufficient documentation

## 2021-04-10 DIAGNOSIS — Z170 Estrogen receptor positive status [ER+]: Secondary | ICD-10-CM | POA: Insufficient documentation

## 2021-04-10 DIAGNOSIS — C50412 Malignant neoplasm of upper-outer quadrant of left female breast: Secondary | ICD-10-CM | POA: Insufficient documentation

## 2021-04-10 NOTE — Progress Notes (Signed)
[facility] Follow up:    Samantha Phlegm, NP [place] [address]   DIAGNOSIS: Cancer Staging Breast cancer of upper-outer quadrant of left female breast [facility]) Staging form: Breast, AJCC 7th Edition - Clinical: No stage assigned - Unsigned Laterality: Left - Pathologic: Stage IIA (T1a(2), N1, cM0) - Signed by Nicholas Lose, MD on 10/15/2014 Laterality: Left Tumor size (mm): 1 Multiple tumors: Yes Number of tumors: 2 Method of lymph node assessment: Sentinel lymph node biopsy Histologic grade (G): G3 Lymph-vascular invasion (LVI): LVI not present (absent)/not identified Residual tumor (R): R0 - None   SUMMARY OF ONCOLOGIC HISTORY: Oncology History  Breast cancer of upper-outer quadrant of left female breast ([place])  09/21/2011 Surgery   Bilateral mastectomies: 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 Migdalia Dk  Breast cancer was ER/PR positive and Her-2 equivical (couldn't do fish due to lack of cells)   02/15/2012 Surgery   Bilateral salpingo-oophorectomy because she was BRCA2 mutation positive   03/27/2012 - 06/03/2017 Anti-estrogen oral therapy   Arimidex 1 mg daily     CURRENT THERAPY: Anastrozole  INTERVAL HISTORY: Samantha Mckay 55 y.o. female returns for evaluation of a lump she feels under her right axilla.  It is hard, and she has felt it for a few days without improvement.  She denies any pain, or new issues elsewhere.  She has no swelling in her arm, or other concerns.  She does not undergo regular mammograms because she is s/p mastectomy and reconstruction.   Patient Active Problem List   Diagnosis Date Noted   Encounter for counseling 04/21/2019   BRCA2 positive 08/11/2018   Breast cancer of upper-outer quadrant of left female breast ([place]) 08/06/2011    has No Known Allergies.  MEDICAL  HISTORY: Past Medical History:  Diagnosis Date   Breast cancer ([place]) 09/22/11     right and left masectomy wirth expanders   Breast cancer ([place]) 09/21/11 BX   Left breast  masectomy-invasive ductal ca   Breast cancer ([place])    ER/PR positive   Cancer ([place])    breast   Liver mass     numerous simple cysts throughout hepatic parenbhyma,one 10.7cm    SURGICAL HISTORY: Past Surgical History:  Procedure Laterality Date   AXILLARY LYMPH NODE DISSECTION  09/21/2011   Procedure: AXILLARY LYMPH NODE DISSECTION;  Surgeon: Imogene Burn. Georgette Dover, MD;  Location: [place];  Service: General;  Laterality: Left;   BREAST IMPLANT EXCHANGE Bilateral 11/17/2012   Procedure: BILATERAL REMOVAL OF TISSUE EXPANDER/PLACEMENT OF IMPLANTS;  Surgeon: Theodoro Kos, DO;  Location: [place];  Service: Plastics;  Laterality: Bilateral;   INTRAUTERINE DEVICE INSERTION     IUD REMOVAL  02/15/2012   Procedure: INTRAUTERINE DEVICE (IUD) REMOVAL;  Surgeon: [name], MD;  Location: [facility];  Service: Gynecology;  Laterality: N/A;   LAPAROSCOPY  02/15/2012   Procedure: LAPAROSCOPY OPERATIVE;  Surgeon: [name], MD;  Location: [facility];  Service: Gynecology;  Laterality: N/A;   MASTECTOMY W/ SENTINEL NODE BIOPSY  09/21/2011   Procedure: MASTECTOMY WITH SENTINEL LYMPH NODE BIOPSY;  Surgeon: Imogene Burn. Georgette Dover, MD;  Location: [place];  Service: General;  Laterality: Bilateral;  Bilateral total mastectomies with left axillary sentinel lymph node biopsy.   SALPINGOOPHORECTOMY  02/15/2012   Procedure: SALPINGO OOPHERECTOMY;  Surgeon: [name], MD;  Location: [facility];  Service: Gynecology;  Laterality: Bilateral;  TISSUE EXPANDER PLACEMENT  09/21/2011   Procedure: TISSUE EXPANDER;  Surgeon: Theodoro Kos, DO;  Location: [facility];  Service: Plastics;  Laterality: Bilateral;  Bilateral breast reconstruction with placement of bilateral tissue expanders.    SOCIAL HISTORY: Social History   Socioeconomic History    Marital status: Married    Spouse name: Not on file   Number of children: Not on file   Years of education: Not on file   Highest education level: Not on file  Occupational History   Not on file  Tobacco Use   Smoking status: Never   Smokeless tobacco: Never   Tobacco comments:    occ glass of wine  Vaping Use   Vaping Use: Never used  Substance and Sexual Activity   Alcohol use: Yes    Alcohol/week: 2.0 standard drinks    Types: 2 Glasses of wine per week   Drug use: No   Sexual activity: Yes    Birth control/protection: I.U.D.    Comment: menarche age 55,1st preg.,age 38,hormones-yes  Other Topics Concern   Not on file  Social History Narrative   Not on file   Social Determinants of Health   Financial Resource Strain: Not on file  Food Insecurity: Not on file  Transportation Needs: Not on file  Physical Activity: Not on file  Stress: Not on file  Social Connections: Not on file  Intimate Partner Violence: Not on file    FAMILY HISTORY: Family History  Problem Relation Age of Onset   Cancer Mother        stomach   Colon cancer Mother    Anesthesia problems Neg Hx    Hypotension Neg Hx    Malignant hyperthermia Neg Hx    Pseudochol deficiency Neg Hx    Colon polyps Neg Hx    Esophageal cancer Neg Hx    Rectal cancer Neg Hx    Stomach cancer Neg Hx     Review of Systems  Constitutional:  Negative for appetite change, chills, fatigue, fever and unexpected weight change.  HENT:   Negative for hearing loss, lump/mass and trouble swallowing.   Eyes:  Negative for eye problems and icterus.  Respiratory:  Negative for chest tightness, cough and shortness of breath.   Cardiovascular:  Negative for chest pain, leg swelling and palpitations.  Gastrointestinal:  Negative for abdominal distention, abdominal pain, constipation, diarrhea, nausea and vomiting.  Endocrine: Negative for hot flashes.  Genitourinary:  Negative for difficulty urinating.   Musculoskeletal:   Negative for arthralgias.  Skin:  Negative for itching and rash.  Neurological:  Negative for dizziness, extremity weakness, headaches and numbness.  Hematological:  Negative for adenopathy. Does not bruise/bleed easily.  Psychiatric/Behavioral:  Negative for depression. The patient is not nervous/anxious.      PHYSICAL EXAMINATION  ECOG PERFORMANCE STATUS: 1 - Symptomatic but completely ambulatory  Vitals:   04/10/21 1228  BP: 118/76  Pulse: 65  Resp: 20  Temp: (!) 97.5 F (36.4 C)  SpO2: 100%    Physical Exam Constitutional:      General: She is not in acute distress.    Appearance: Normal appearance. She is not toxic-appearing.  HENT:     Head: Normocephalic and atraumatic.  Eyes:     General: No scleral icterus. Cardiovascular:     Rate and Rhythm: Normal rate and regular rhythm.     Pulses: Normal pulses.     Heart sounds: Normal heart sounds.  Pulmonary:     Effort: Pulmonary effort  is normal.     Breath sounds: Normal breath sounds.     Comments: Right axilla with small 1cm hard nodule Abdominal:     General: Abdomen is flat. Bowel sounds are normal. There is no distension.     Palpations: Abdomen is soft.     Tenderness: There is no abdominal tenderness.  Musculoskeletal:        General: No swelling.     Cervical back: Neck supple.  Lymphadenopathy:     Cervical: No cervical adenopathy.  Skin:    General: Skin is warm and dry.     Findings: No rash.  Neurological:     General: No focal deficit present.     Mental Status: She is alert.  Psychiatric:        Mood and Affect: Mood normal.        Behavior: Behavior normal.    LABORATORY DATA:  CBC    Component Value Date/Time   WBC 3.9 (L) 08/15/2019 1453   WBC 4.0 10/21/2015 1443   WBC 4.3 02/08/2012 1144   RBC 4.71 08/15/2019 1453   HGB 12.5 08/15/2019 1453   HGB 12.5 10/21/2015 1443   HCT 40.9 08/15/2019 1453   HCT 39.4 10/21/2015 1443   PLT 221 08/15/2019 1453   PLT 192 10/21/2015 1443    MCV 86.8 08/15/2019 1453   MCV 82.7 10/21/2015 1443   MCH 26.5 08/15/2019 1453   MCHC 30.6 08/15/2019 1453   RDW 13.2 08/15/2019 1453   RDW 12.9 10/21/2015 1443   LYMPHSABS 2.1 08/15/2019 1453   LYMPHSABS 2.3 10/21/2015 1443   MONOABS 0.3 08/15/2019 1453   MONOABS 0.4 10/21/2015 1443   EOSABS 0.1 08/15/2019 1453   EOSABS 0.1 10/21/2015 1443   BASOSABS 0.0 08/15/2019 1453   BASOSABS 0.0 10/21/2015 1443    CMP     Component Value Date/Time   NA 142 08/15/2019 1453   NA 142 10/21/2015 1444   K 4.5 08/15/2019 1453   K 4.8 10/21/2015 1444   CL 109 08/15/2019 1453   CL 107 06/21/2012 1536   CO2 25 08/15/2019 1453   CO2 28 10/21/2015 1444   GLUCOSE 89 08/15/2019 1453   GLUCOSE 91 10/21/2015 1444   GLUCOSE 90 06/21/2012 1536   BUN 12 08/15/2019 1453   BUN 13.3 10/21/2015 1444   CREATININE 0.95 08/15/2019 1453   CREATININE 0.9 10/21/2015 1444   CALCIUM 8.5 (L) 08/15/2019 1453   CALCIUM 9.1 10/21/2015 1444   PROT 7.1 08/15/2019 1453   PROT 7.0 10/21/2015 1444   ALBUMIN 3.7 08/15/2019 1453   ALBUMIN 3.4 (L) 10/21/2015 1444   AST 22 08/15/2019 1453   AST 20 10/21/2015 1444   ALT 20 08/15/2019 1453   ALT 14 10/21/2015 1444   ALKPHOS 98 08/15/2019 1453   ALKPHOS 89 10/21/2015 1444   BILITOT 0.4 08/15/2019 1453   BILITOT 0.42 10/21/2015 1444   GFRNONAA >60 08/15/2019 1453   GFRAA >60 08/15/2019 1453     ASSESSMENT and THERAPY PLAN:   Breast cancer of upper-outer quadrant of left female breast ([place]) 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  I placed  orders for an ultrasound to better evaluate.  The breast center could not get her for two weeks, so we sent her to [facility] and she will undergo evaluation on Monday, 9/19.           Orders Placed This Encounter  Procedures   US BREAST LTD UNI RIGHT INC AXILLA    Standing Status:   Future    Standing Expiration Date:   04/10/2022    Order Specific Question:   Reason for Exam (SYMPTOM  OR DIAGNOSIS REQUIRED)    Answer:   right axillary lump, s/p right breast mastectomy and reconstruction    Order Specific Question:   Preferred imaging location?    Answer:   [facility]    All questions were answered. The patient knows to call the clinic with any problems, questions or concerns. We can certainly see the patient much sooner if necessary.  Total encounter time: 20 minutes in face to face visit time, chart review, order entry, care coordination, and documentation of the encounter.    Wilber Bihari, NP 04/12/21 2:17 PM Medical Oncology and Hematology [facility] [place], [place] [address] Tel. [phone]    Fax. [phone]  *Total Encounter Time as defined by the Centers for Medicare and Medicaid Services includes, in addition to the face-to-face time of a patient visit (documented in the note above) non-face-to-face time: obtaining and reviewing outside history, ordering and reviewing medications, tests or procedures, care coordination (communications with other health care professionals or caregivers) and documentation in the medical record.

## 2021-04-12 ENCOUNTER — Encounter: Payer: Self-pay | Admitting: Adult Health

## 2021-04-12 NOTE — Assessment & Plan Note (Signed)
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  I placed orders for an ultrasound to better evaluate.  The breast center could not get her for two weeks, so we sent her to [facility] and she will undergo evaluation on Monday, 9/19.

## 2021-05-01 ENCOUNTER — Other Ambulatory Visit: Payer: Medicaid Other

## 2021-07-29 ENCOUNTER — Telehealth: Payer: Self-pay | Admitting: Adult Health

## 2021-07-29 NOTE — Telephone Encounter (Signed)
Rescheduled per 12/30 provider request, pt has been called and confirmed new appt

## 2021-08-12 ENCOUNTER — Encounter: Payer: Medicaid Other | Admitting: Adult Health

## 2021-08-18 NOTE — Progress Notes (Signed)
CLINIC:  Survivorship   REASON FOR VISIT:  Routine follow-up for history of breast cancer.   BRIEF ONCOLOGIC HISTORY:  Oncology History  Breast cancer of upper-outer quadrant of left female breast ([place])  09/21/2011 Surgery   Bilateral mastectomies: 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 Migdalia Dk  Breast cancer was ER/PR positive and Her-2 equivical (couldn't do fish due to lack of cells)   02/15/2012 Surgery   Bilateral salpingo-oophorectomy because she was BRCA2 mutation positive   03/27/2012 - 06/03/2017 Anti-estrogen oral therapy   Arimidex 1 mg daily      INTERVAL HISTORY:  Ms. Buonomo presents to the [facility] today for routine follow-up for her history of breast cancer.  Overall, she reports feeling quite well. She has no new issues today, and tells me that she is working on losing weight.  She is s/p bilateral mastectomies.    Since she is BRCA2 positive and has a family history of pancreatic cancer she underwent MRCP on August 29, 2019 which showed no evidence of pancreatic mass or abdominal metastatic disease.  REVIEW OF SYSTEMS:  Review of Systems  Constitutional:  Negative for appetite change, chills, fatigue, fever and unexpected weight change.  HENT:   Negative for hearing loss, lump/mass and trouble swallowing.   Eyes:  Negative for eye problems and icterus.  Respiratory:  Negative for chest tightness, cough and shortness of breath.   Cardiovascular:  Negative for chest pain, leg swelling and palpitations.  Gastrointestinal:  Negative for abdominal distention, abdominal pain, constipation, diarrhea, nausea and vomiting.  Endocrine: Negative for hot flashes.  Genitourinary:  Negative for difficulty urinating.   Musculoskeletal:  Negative for arthralgias.  Skin:  Negative for itching and rash.  Neurological:  Negative for dizziness,  extremity weakness, headaches and numbness.  Hematological:  Negative for adenopathy. Does not bruise/bleed easily.  Psychiatric/Behavioral:  Negative for depression. The patient is not nervous/anxious.   Breast: Denies any new nodularity, masses, tenderness, nipple changes, or nipple discharge.       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 Georgette Dover, MD;  Location: [facility];  Service: General;  Laterality: Left;   BREAST IMPLANT EXCHANGE Bilateral 11/17/2012   Procedure: BILATERAL REMOVAL OF TISSUE EXPANDER/PLACEMENT OF IMPLANTS;  Surgeon: Theodoro Kos, DO;  Location: [place];  Service: Plastics;  Laterality: Bilateral;   INTRAUTERINE DEVICE INSERTION     IUD REMOVAL  02/15/2012   Procedure: INTRAUTERINE DEVICE (IUD) REMOVAL;  Surgeon: [name], MD;  Location: [facility];  Service: Gynecology;  Laterality: N/A;   LAPAROSCOPY  02/15/2012   Procedure: LAPAROSCOPY OPERATIVE;  Surgeon: [name], MD;  Location: [facility];  Service: Gynecology;  Laterality: N/A;   MASTECTOMY W/ SENTINEL NODE BIOPSY  09/21/2011   Procedure: MASTECTOMY WITH SENTINEL LYMPH NODE BIOPSY;  Surgeon: Imogene Burn. Georgette Dover, MD;  Location: [facility];  Service: General;  Laterality: Bilateral;  Bilateral total mastectomies with left axillary sentinel lymph node biopsy.   SALPINGOOPHORECTOMY  02/15/2012   Procedure: SALPINGO OOPHERECTOMY;  Surgeon: [name], MD;  Location: [facility];  Service: Gynecology;  Laterality:  Bilateral;   TISSUE EXPANDER PLACEMENT  09/21/2011   Procedure: TISSUE EXPANDER;  Surgeon: Theodoro Kos, DO;  Location: [facility];  Service: Plastics;  Laterality: Bilateral;  Bilateral breast reconstruction with placement of bilateral tissue expanders.     ALLERGIES:  No Known Allergies   CURRENT MEDICATIONS:  Outpatient Encounter Medications as of 08/19/2021  Medication Sig Note   acetaminophen (TYLENOL) 500 MG tablet Take by mouth. 04/09/2015: Received from: [facility]   calcium carbonate (OS-CAL) 600 MG TABS Take 600 mg by mouth 2 (two) times daily with a meal.    cholecalciferol (VITAMIN D) 1000 UNITS tablet Take 1,000 Units by mouth 2 (two) times daily.    Multiple Vitamin (MULTIVITAMIN) tablet Take 1 tablet by mouth daily.    Facility-Administered Encounter Medications as of 08/19/2021  Medication   0.9 %  sodium chloride infusion     ONCOLOGIC FAMILY HISTORY:  Family History  Problem Relation Age of Onset   Cancer Mother        stomach   Colon cancer Mother    Anesthesia problems Neg Hx    Hypotension Neg Hx    Malignant hyperthermia Neg Hx    Pseudochol deficiency Neg Hx    Colon polyps Neg Hx    Esophageal cancer Neg Hx    Rectal cancer Neg Hx    Stomach cancer Neg Hx     GENETIC COUNSELING/TESTING: BRCA2 positive  SOCIAL HISTORY:  Reviewed with patient no updates.  PHYSICAL EXAMINATION:  Vital Signs: Vitals:   08/19/21 1135  BP: 124/78  Pulse: 77  Resp: 16  Temp: 98.1 F (36.7 C)  SpO2: 100%   Filed Weights   08/19/21 1135  Weight: 172 lb 9.6 oz (78.3 kg)   GENERAL: Patient is a well appearing female in no acute distress HEENT:  Sclerae anicteric.  Oropharynx clear and moist. No ulcerations or evidence of oropharyngeal candidiasis. Neck is supple.  NODES:  No cervical, supraclavicular, or axillary lymphadenopathy palpated.  BREAST EXAM: Status post bilateral mastectomies, no sign of local recurrence. LUNGS:  Clear to auscultation bilaterally.   No wheezes or rhonchi. HEART:  Regular rate and rhythm. No murmur appreciated. ABDOMEN:  Soft, nontender.  Positive, normoactive bowel sounds. No organomegaly palpated. MSK:  No focal spinal tenderness to palpation. Full range of motion bilaterally in the upper extremities. EXTREMITIES:  No peripheral edema.   SKIN:  Clear with no obvious rashes or skin changes. No nail dyscrasia. NEURO:  Nonfocal. Well oriented.  Appropriate affect.  LABORATORY DATA:  None for this visit   DIAGNOSTIC IMAGING:  Most recent mammogram: Not applicable, status post bilateral mastectomies    ASSESSMENT AND PLAN:  Ms.. Jeffrey is a pleasant 56 y.o. female with history of Stage 1-2 left sided invasive ductal carcinoma ER/PR positive.  She was diagnosed in 2013 and treated with bilateral mastectomies and anastrozole x5 years which she completed in November 2018 She [facility] for surveillance and routine follow-up.   1. History of breast cancer:  Ms. Chipman is currently clinically and radiographically without evidence of disease or recurrence of breast cancer.  She will return in 1 year for continued follow-up.  Since she has undergone bilateral mastectomies annual mammograms are not indicated.  I encouraged her to  call me with any questions or concerns before her next visit at the cancer center, and I would be happy to see her sooner, if needed.    2. BRCA2+: She has undergone bilateral mastectomies and bilateral salpingo-oophorectomy.  I placed orders for repeat MRCP due to her fh of pancreatic cancer which increases her risk for pancreatic cancer.     3. Bone health:   She was given education on specific food and activities to promote bone health.  4. Cancer screening:  Due to Ms. Rominger's history and her age, she should receive screening for skin cancers, colon cancer. She was encouraged to follow-up with her PCP for appropriate cancer screenings.   5. Health maintenance and  wellness promotion: Ms. Colbaugh was encouraged to consume 5-7 servings of fruits and vegetables per day. She was also encouraged to engage in moderate to vigorous exercise for 30 minutes per day most days of the week. She was instructed to limit her alcohol consumption and continue to abstain from tobacco use.    Dispo:  -Return to cancer center in 1 year for continued follow-up -MRCP ordered   Total encounter time: 20 minutes in face-to-face visit time, chart review, lab review, care coordination, order entry, and documentation of the encounter.    Wilber Bihari, NP 08/19/21 2:58 PM Medical Oncology and Hematology [facility] [place], [place] [address] Tel. [phone]    Fax. [phone]  *Total Encounter Time as defined by the Centers for Medicare and Medicaid Services includes, in addition to the face-to-face time of a patient visit (documented in the note above) non-face-to-face time: obtaining and reviewing outside history, ordering and reviewing medications, tests or procedures, care coordination (communications with other health care professionals or caregivers) and documentation in the medical record.   Note: PRIMARY CARE PROVIDER Wilber Bihari [place], [place] [phone]

## 2021-08-19 ENCOUNTER — Other Ambulatory Visit: Payer: Self-pay

## 2021-08-19 ENCOUNTER — Inpatient Hospital Stay: Payer: Medicaid Other | Attending: Adult Health | Admitting: Adult Health

## 2021-08-19 VITALS — BP 124/78 | HR 77 | Temp 98.1°F | Resp 16 | Ht 61.0 in | Wt 172.6 lb

## 2021-08-19 DIAGNOSIS — Z9013 Acquired absence of bilateral breasts and nipples: Secondary | ICD-10-CM | POA: Diagnosis not present

## 2021-08-19 DIAGNOSIS — Z1509 Genetic susceptibility to other malignant neoplasm: Secondary | ICD-10-CM

## 2021-08-19 DIAGNOSIS — Z90722 Acquired absence of ovaries, bilateral: Secondary | ICD-10-CM | POA: Diagnosis not present

## 2021-08-19 DIAGNOSIS — Z1501 Genetic susceptibility to malignant neoplasm of breast: Secondary | ICD-10-CM | POA: Diagnosis not present

## 2021-08-19 DIAGNOSIS — Z170 Estrogen receptor positive status [ER+]: Secondary | ICD-10-CM

## 2021-08-19 DIAGNOSIS — C50412 Malignant neoplasm of upper-outer quadrant of left female breast: Secondary | ICD-10-CM | POA: Diagnosis not present

## 2021-08-19 DIAGNOSIS — Z79811 Long term (current) use of aromatase inhibitors: Secondary | ICD-10-CM | POA: Diagnosis not present

## 2021-08-19 DIAGNOSIS — Z800 Family history of malignant neoplasm of digestive organs: Secondary | ICD-10-CM | POA: Diagnosis not present

## 2021-08-19 DIAGNOSIS — Z79899 Other long term (current) drug therapy: Secondary | ICD-10-CM | POA: Insufficient documentation

## 2022-08-25 ENCOUNTER — Inpatient Hospital Stay: Payer: Medicaid Other | Attending: Adult Health | Admitting: Adult Health

## 2022-08-25 ENCOUNTER — Encounter: Payer: Self-pay | Admitting: Adult Health

## 2022-08-25 VITALS — BP 124/80 | HR 81 | Temp 97.7°F | Resp 16 | Ht 61.0 in | Wt 187.2 lb

## 2022-08-25 DIAGNOSIS — Z79899 Other long term (current) drug therapy: Secondary | ICD-10-CM | POA: Diagnosis not present

## 2022-08-25 DIAGNOSIS — Z1501 Genetic susceptibility to malignant neoplasm of breast: Secondary | ICD-10-CM

## 2022-08-25 DIAGNOSIS — C50412 Malignant neoplasm of upper-outer quadrant of left female breast: Secondary | ICD-10-CM

## 2022-08-25 DIAGNOSIS — Z170 Estrogen receptor positive status [ER+]: Secondary | ICD-10-CM

## 2022-08-25 DIAGNOSIS — Z79811 Long term (current) use of aromatase inhibitors: Secondary | ICD-10-CM | POA: Insufficient documentation

## 2022-08-25 DIAGNOSIS — Z90721 Acquired absence of ovaries, unilateral: Secondary | ICD-10-CM | POA: Insufficient documentation

## 2022-08-25 DIAGNOSIS — Z7289 Other problems related to lifestyle: Secondary | ICD-10-CM | POA: Insufficient documentation

## 2022-08-25 DIAGNOSIS — Z800 Family history of malignant neoplasm of digestive organs: Secondary | ICD-10-CM | POA: Diagnosis not present

## 2022-08-25 DIAGNOSIS — Z1509 Genetic susceptibility to other malignant neoplasm: Secondary | ICD-10-CM | POA: Insufficient documentation

## 2022-08-25 DIAGNOSIS — Z9013 Acquired absence of bilateral breasts and nipples: Secondary | ICD-10-CM | POA: Insufficient documentation

## 2022-08-25 NOTE — Assessment & Plan Note (Addendum)
Katena is a 57 year old BRCA2 positive woman here today for f/u and evaluation of her history of stage IIA left sided breast cancer, ER/PR positive s/p bilateral mastectomies followed by adjuvant antiestrogen therapy with Anastrozole.  Donnette has no clinical or radiographic signs of breast cancer recurrence.  She will return annually for breast exam.    Due to her BRCA2 positivity and fh of pancreatic cancer I ordered MRCP of the abdomen to evaluate and screen for pancreatic cancer as recommended by NCCN guidelines.    We discussed her weight in detail.  I reviewed healthy diet and exercise recommendations with her, and recommended that she document her trial of diet and exercise.  We discussed the option of weight loss medications if traditional diet and exercise are not effective.    Yaretzi will reach out after trying initial diet and exercise recommendations if she needs guidance on where to go for weight loss medication.  If her hip worsens I offered to order xrays for further evaluation.    She will return in 1 year for continued long term follow up.

## 2022-08-25 NOTE — Progress Notes (Signed)
[name] Follow up:    [name], NP [place] [address]   DIAGNOSIS:  Cancer Staging  Breast cancer of upper-outer quadrant of left female breast [name]) Staging form: Breast, AJCC 7th Edition - Clinical: No stage assigned - Unsigned Laterality: Left - Pathologic: Stage IIA (T1a(2), N1, cM0) - Signed by Nicholas Lose, MD on 10/15/2014 Laterality: Left Tumor size (mm): 1 Multiple tumors: Yes Number of tumors: 2 Method of lymph node assessment: Sentinel lymph node biopsy Histologic grade (G): G3 Lymph-vascular invasion (LVI): LVI not present (absent)/not identified Residual tumor (R): R0 - None   SUMMARY OF ONCOLOGIC HISTORY: Oncology History  Breast cancer of upper-outer quadrant of left female breast (Samantha Mckay)  09/21/2011 Surgery   Bilateral mastectomies: 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 Migdalia Dk  Breast cancer was ER/PR positive and Her-2 equivical (couldn't do fish due to lack of cells)   02/15/2012 Surgery   Bilateral salpingo-oophorectomy because she was BRCA2 mutation positive   03/27/2012 - 06/03/2017 Anti-estrogen oral therapy   Arimidex 1 mg daily     CURRENT THERAPY: observation  INTERVAL HISTORY: Samantha Mckay 57 y.o. female returns for f/u of her history of breast cancer and BRCA2 mutation.  She is s/p bilateral mastectomy with reconstruction.  She denies any changes to her breasts.  She fell about 2 months ago and experiences intermittent aching at relieved with tylenol/advil.  This is slowly improving.    She is due for MRCP abdomen for pancreatic cancer screening due to her BRCA2 mutation and FH of pancreatic cancer.  She has some weight concerns, as her weight is 187 which is increased from prior.  She normally weighs about 155 pounds.     Patient Active Problem List   Diagnosis  Date Noted   Encounter for counseling 04/21/2019   BRCA2 positive 08/11/2018   Acquired absence of bilateral breasts and nipples 11/11/2012   Breast cancer of upper-outer quadrant of left female breast (Samantha Mckay) 08/06/2011    has No Known Allergies.  MEDICAL HISTORY: Past Medical History:  Diagnosis Date   Breast cancer (Samantha Mckay) 09/22/11     right and left masectomy wirth expanders   Breast cancer (Samantha Mckay) 09/21/11 BX   Left breast  masectomy-invasive ductal ca   Breast cancer (Samantha Mckay)    ER/PR positive   Cancer (Samantha Mckay)    breast   Liver mass     numerous simple cysts throughout hepatic parenbhyma,one 10.7cm    SURGICAL HISTORY: Past Surgical History:  Procedure Laterality Date   AXILLARY LYMPH NODE DISSECTION  09/21/2011   Procedure: AXILLARY LYMPH NODE DISSECTION;  Surgeon: Imogene Burn. Georgette Dover, MD;  Location: [place];  Service: General;  Laterality: Left;   BREAST IMPLANT EXCHANGE Bilateral 11/17/2012   Procedure: BILATERAL REMOVAL OF TISSUE EXPANDER/PLACEMENT OF IMPLANTS;  Surgeon: Theodoro Kos, DO;  Location: [place];  Service: Plastics;  Laterality: Bilateral;   INTRAUTERINE DEVICE INSERTION     IUD REMOVAL  02/15/2012   Procedure: INTRAUTERINE DEVICE (IUD) REMOVAL;  Surgeon: [name], MD;  Location: [facility];  Service: Gynecology;  Laterality: N/A;   LAPAROSCOPY  02/15/2012   Procedure: LAPAROSCOPY OPERATIVE;  Surgeon: [name], MD;  Location: [facility];  Service: Gynecology;  Laterality: N/A;   MASTECTOMY W/ SENTINEL NODE BIOPSY  09/21/2011   Procedure: MASTECTOMY WITH SENTINEL LYMPH NODE BIOPSY;  Surgeon: Imogene Burn. Tsuei, MD;  [facility];  Service: General;  Laterality: Bilateral;  Bilateral total mastectomies with left axillary sentinel lymph node biopsy.   SALPINGOOPHORECTOMY  02/15/2012   Procedure: SALPINGO OOPHERECTOMY;  Surgeon: [name], MD;  Location: [facility];  Service: Gynecology;  Laterality: Bilateral;   TISSUE EXPANDER PLACEMENT  09/21/2011    Procedure: TISSUE EXPANDER;  Surgeon: Theodoro Kos, DO;  Location: [place];  Service: Plastics;  Laterality: Bilateral;  Bilateral breast reconstruction with placement of bilateral tissue expanders.    SOCIAL HISTORY: Social History   Socioeconomic History   Marital status: Divorced    Spouse name: Not on file   Number of children: Not on file   Years of education: Not on file   Highest education level: Not on file  Occupational History   Not on file  Tobacco Use   Smoking status: Never   Smokeless tobacco: Never   Tobacco comments:    occ glass of wine  Vaping Use   Vaping Use: Never used  Substance and Sexual Activity   Alcohol use: Yes    Alcohol/week: 2.0 standard drinks of alcohol    Types: 2 Glasses of wine per week   Drug use: No   Sexual activity: Yes    Birth control/protection: I.U.D.    Comment: menarche age 30,1st preg.,age 37,hormones-yes  Other Topics Concern   Not on file  Social History Narrative   Not on file   Social Determinants of Health   Financial Resource Strain: Not on file  Food Insecurity: Not on file  Transportation Needs: Not on file  Physical Activity: Not on file  Stress: Not on file  Social Connections: Not on file  Intimate Partner Violence: Not on file    FAMILY HISTORY: Family History  Problem Relation Age of Onset   Cancer Mother        stomach   Colon cancer Mother    Anesthesia problems Neg Hx    Hypotension Neg Hx    Malignant hyperthermia Neg Hx    Pseudochol deficiency Neg Hx    Colon polyps Neg Hx    Esophageal cancer Neg Hx    Rectal cancer Neg Hx    Stomach cancer Neg Hx     Review of Systems  Constitutional:  Negative for appetite change, chills, fatigue, fever and unexpected weight change.  HENT:   Negative for hearing loss, lump/mass and trouble swallowing.   Eyes:  Negative for eye problems and icterus.  Respiratory:  Negative for chest tightness, cough and shortness of breath.   Cardiovascular:  Negative  for chest pain, leg swelling and palpitations.  Gastrointestinal:  Negative for abdominal distention, abdominal pain, constipation, diarrhea, nausea and vomiting.  Endocrine: Negative for hot flashes.  Genitourinary:  Negative for difficulty urinating.   Musculoskeletal:  Negative for arthralgias.  Skin:  Negative for itching and rash.  Neurological:  Negative for dizziness, extremity weakness, headaches and numbness.  Hematological:  Negative for adenopathy. Does not bruise/bleed easily.  Psychiatric/Behavioral:  Negative for depression. The patient is not nervous/anxious.       PHYSICAL EXAMINATION  ECOG PERFORMANCE STATUS: 1 - Symptomatic but completely ambulatory  Vitals:   08/25/22 1120  BP: 124/80  Pulse: 81  Resp: 16  Temp: 97.7 F (36.5 C)  SpO2: 100%    Physical Exam Constitutional:      General: She is not in acute distress.    Appearance: Normal appearance. She is not toxic-appearing.  HENT:     Head: Normocephalic and  atraumatic.  Eyes:     General: No scleral icterus. Cardiovascular:     Rate and Rhythm: Normal rate and regular rhythm.     Pulses: Normal pulses.     Heart sounds: Normal heart sounds.  Pulmonary:     Effort: Pulmonary effort is normal.     Breath sounds: Normal breath sounds.  Chest:     Comments: Bilateral breast s/p mastectomies and reconstruction, no sign of local recurrence Abdominal:     General: Abdomen is flat. Bowel sounds are normal. There is no distension.     Palpations: Abdomen is soft.     Tenderness: There is no abdominal tenderness.  Musculoskeletal:        General: No swelling.     Cervical back: Neck supple.  Lymphadenopathy:     Cervical: No cervical adenopathy.  Skin:    General: Skin is warm and dry.     Findings: No rash.  Neurological:     General: No focal deficit present.     Mental Status: She is alert.  Psychiatric:        Mood and Affect: Mood normal.        Behavior: Behavior normal.      LABORATORY DATA: None for this visit  ASSESSMENT and THERAPY PLAN:   Breast cancer of upper-outer quadrant of left female breast ([place]) Samantha Mckay is a 57 year old BRCA2 positive woman here today for f/u and evaluation of her history of stage IIA left sided breast cancer, ER/PR positive s/p bilateral mastectomies followed by adjuvant antiestrogen therapy with Anastrozole.  Bettyjane has no clinical or radiographic signs of breast cancer recurrence.  She will return annually for breast exam.    Due to her BRCA2 positivity and fh of pancreatic cancer I ordered MRCP of the abdomen to evaluate and screen for pancreatic cancer as recommended by NCCN guidelines.    We discussed her weight in detail.  I reviewed healthy diet and exercise recommendations with her, and recommended that she document her trial of diet and exercise.  We discussed the option of weight loss medications if traditional diet and exercise are not effective.    Andora will reach out after trying initial diet and exercise recommendations if she needs guidance on where to go for weight loss medication.  If her hip worsens I offered to order xrays for further evaluation.    She will return in 1 year for continued long term follow up.    All questions were answered. The patient knows to call the clinic with any problems, questions or concerns. We can certainly see the patient much sooner if necessary.  Total encounter time:30 minutes*in face-to-face visit time, chart review, lab review, care coordination, order entry, and documentation of the encounter time.    Wilber Bihari, NP 08/25/22 12:20 PM Medical Oncology and Hematology [facility] [place], [place] [address] Tel. [phone]    Fax. [phone]  *Total Encounter Time as defined by the Centers for Medicare and Medicaid Services includes, in addition to the face-to-face time of a patient visit (documented in the note above)  non-face-to-face time: obtaining and reviewing outside history, ordering and reviewing medications, tests or procedures, care coordination (communications with other health care professionals or caregivers) and documentation in the medical record.

## 2022-09-08 ENCOUNTER — Encounter (HOSPITAL_COMMUNITY): Payer: Self-pay

## 2022-09-08 ENCOUNTER — Other Ambulatory Visit: Payer: Self-pay | Admitting: Adult Health

## 2022-09-08 ENCOUNTER — Ambulatory Visit (HOSPITAL_COMMUNITY)
Admission: RE | Admit: 2022-09-08 | Discharge: 2022-09-08 | Disposition: A | Discharge status: Home / Self Care | Payer: Medicaid Other | Source: Ambulatory Visit | Attending: Adult Health | Admitting: Adult Health

## 2022-09-08 DIAGNOSIS — Z800 Family history of malignant neoplasm of digestive organs: Secondary | ICD-10-CM

## 2022-09-08 DIAGNOSIS — Z170 Estrogen receptor positive status [ER+]: Secondary | ICD-10-CM

## 2022-09-08 DIAGNOSIS — Z1501 Genetic susceptibility to malignant neoplasm of breast: Secondary | ICD-10-CM

## 2023-08-26 ENCOUNTER — Inpatient Hospital Stay: Payer: Managed Care, Other (non HMO) | Attending: Adult Health | Admitting: Adult Health

## 2023-08-26 ENCOUNTER — Encounter: Payer: Self-pay | Admitting: Adult Health

## 2023-08-26 VITALS — BP 107/78 | HR 90 | Temp 97.7°F | Resp 16 | Wt 178.9 lb

## 2023-08-26 DIAGNOSIS — Z1721 Progesterone receptor positive status: Secondary | ICD-10-CM | POA: Insufficient documentation

## 2023-08-26 DIAGNOSIS — Z79899 Other long term (current) drug therapy: Secondary | ICD-10-CM | POA: Diagnosis not present

## 2023-08-26 DIAGNOSIS — C50412 Malignant neoplasm of upper-outer quadrant of left female breast: Secondary | ICD-10-CM

## 2023-08-26 DIAGNOSIS — Z800 Family history of malignant neoplasm of digestive organs: Secondary | ICD-10-CM

## 2023-08-26 DIAGNOSIS — Z90721 Acquired absence of ovaries, unilateral: Secondary | ICD-10-CM | POA: Insufficient documentation

## 2023-08-26 DIAGNOSIS — Z170 Estrogen receptor positive status [ER+]: Secondary | ICD-10-CM | POA: Diagnosis not present

## 2023-08-26 DIAGNOSIS — Z1501 Genetic susceptibility to malignant neoplasm of breast: Secondary | ICD-10-CM

## 2023-08-26 DIAGNOSIS — Z1509 Genetic susceptibility to other malignant neoplasm: Secondary | ICD-10-CM | POA: Diagnosis not present

## 2023-08-26 DIAGNOSIS — Z79811 Long term (current) use of aromatase inhibitors: Secondary | ICD-10-CM | POA: Insufficient documentation

## 2023-08-26 DIAGNOSIS — Z9013 Acquired absence of bilateral breasts and nipples: Secondary | ICD-10-CM | POA: Diagnosis not present

## 2023-08-30 NOTE — Assessment & Plan Note (Signed)
Samantha Mckay is a 58 year old BRCA2 positive woman here today for f/u and evaluation of her history of stage IIA left sided breast cancer, ER/PR positive s/p bilateral mastectomies followed by adjuvant antiestrogen therapy with Anastrozole.  History of breast cancer: s/p bilateral mastectomies, no sign of local recurrence.  Continue with annual clinical breast exam.  BRCA 2 mutation: S/p bilateral mastectomies and BSO.  Due to FH of pancreatic cancer, she meets criteria for MRCP to screen for pancreatic cancer.  Reviewed risks and benefits and she would like to proceed.  Health maintenance: Recommended continued f/u with her PCP.  We discussed healthy diet and exercise today.  RTC in 1 year for continued long-term f/u.

## 2023-08-30 NOTE — Progress Notes (Signed)
[facility] Cancer Follow up:    Default, Provider, MD No address on file   DIAGNOSIS:  Cancer Staging  Breast cancer of upper-outer quadrant of left female breast ([facility]) Staging form: Breast, AJCC 7th Edition - Clinical: No stage assigned - Unsigned Laterality: Left - Pathologic: Stage IIA (T1a(2), N1, cM0) - Signed by Serena Croissant, MD on 10/15/2014 Laterality: Left Tumor size (mm): 1 Multiple tumors: Yes Number of tumors: 2 Method of lymph node assessment: Sentinel lymph node biopsy Histologic grade (G): G3 Lymph-vascular invasion (LVI): LVI not present (absent)/not identified Residual tumor (R): R0 - None   SUMMARY OF ONCOLOGIC HISTORY: Oncology History  Breast cancer of upper-outer quadrant of left female breast ([facility])  09/21/2011 Surgery   Bilateral mastectomies: 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 Kelly Splinter  Breast cancer was ER/PR positive and Her-2 equivical (couldn't do fish due to lack of cells)   02/15/2012 Surgery   Bilateral salpingo-oophorectomy because she was BRCA2 mutation positive   03/27/2012 - 06/03/2017 Anti-estrogen oral therapy   Arimidex 1 mg daily     CURRENT THERAPY: Observation  INTERVAL HISTORY: Samantha Mckay 58 y.o. female returns for f/u of her history of breast cancer.  She is s/po bilateral mastectomies with reconstruction.  She is doing well this year and denies any health changes.  She is not exercising as much as she would like.  She is working on making changes in her diet that are consistent with a whole food plant based approach.   She has BRCA 2 mutation and family history of pancreatic cancer.     Patient Active Problem List   Diagnosis Date Noted   Encounter for counseling 04/21/2019   BRCA2 positive 08/11/2018   Acquired absence of bilateral breasts and nipples 11/11/2012   Breast cancer of  upper-outer quadrant of left female breast ([facility]) 08/06/2011    has no known allergies.  MEDICAL HISTORY: Past Medical History:  Diagnosis Date   Breast cancer ([facility]) 09/22/11     right and left masectomy wirth expanders   Breast cancer ([facility]) 09/21/11 BX   Left breast  masectomy-invasive ductal ca   Breast cancer ([facility])    ER/PR positive   Cancer ([facility])    breast   Liver mass     numerous simple cysts throughout hepatic parenbhyma,one 10.7cm    SURGICAL HISTORY: Past Surgical History:  Procedure Laterality Date   AXILLARY LYMPH NODE DISSECTION  09/21/2011   Procedure: AXILLARY LYMPH NODE DISSECTION;  Surgeon: Wilmon Arms. Corliss Skains, MD;  Location[facility];  Service: General;  Laterality: Left;   BREAST IMPLANT EXCHANGE Bilateral 11/17/2012   Procedure: BILATERAL REMOVAL OF TISSUE EXPANDER/PLACEMENT OF IMPLANTS;  Surgeon: Wayland Denis, DO;  Location: [facility];  Service: Plastics;  Laterality: Bilateral;   INTRAUTERINE DEVICE INSERTION     IUD REMOVAL  02/15/2012   Procedure: INTRAUTERINE DEVICE (IUD) REMOVAL;  Surgeon: Zelphia Cairo, MD;  Location: [facility];  Service: Gynecology;  Laterality: N/A;   LAPAROSCOPY  02/15/2012   Procedure: LAPAROSCOPY OPERATIVE;  Surgeon: Zelphia Cairo, MD;  Location: [facility];  Service: Gynecology;  Laterality: N/A;   MASTECTOMY W/ SENTINEL NODE BIOPSY  09/21/2011   Procedure: MASTECTOMY WITH SENTINEL LYMPH NODE BIOPSY;  Surgeon: Wilmon Arms. Corliss Skains, MD;  Location[facility];  Service: General;  Laterality: Bilateral;  Bilateral total mastectomies with left axillary sentinel lymph node biopsy.   SALPINGOOPHORECTOMY  02/15/2012  Procedure: SALPINGO OOPHERECTOMY;  Surgeon: Zelphia Cairo, MD;  Location: [facility];  Service: Gynecology;  Laterality: Bilateral;   TISSUE EXPANDER PLACEMENT  09/21/2011   Procedure: TISSUE EXPANDER;  Surgeon: Wayland Denis, DO;  Location[facility];  Service: Plastics;  Laterality: Bilateral;  Bilateral breast reconstruction with  placement of bilateral tissue expanders.    SOCIAL HISTORY: Social History   Socioeconomic History   Marital status: Divorced    Spouse name: Not on file   Number of children: Not on file   Years of education: Not on file   Highest education level: Not on file  Occupational History   Not on file  Tobacco Use   Smoking status: Never   Smokeless tobacco: Never   Tobacco comments:    occ glass of wine  Vaping Use   Vaping status: Never Used  Substance and Sexual Activity   Alcohol use: Yes    Alcohol/week: 2.0 standard drinks of alcohol    Types: 2 Glasses of wine per week   Drug use: No   Sexual activity: Yes    Birth control/protection: I.U.D.    Comment: menarche age 15,1st preg.,age 76,hormones-yes  Other Topics Concern   Not on file  Social History Narrative   Not on file   Social Drivers of Health   Financial Resource Strain: Not on file  Food Insecurity: Not on file  Transportation Needs: Not on file  Physical Activity: Not on file  Stress: Not on file  Social Connections: Not on file  Intimate Partner Violence: Not on file    FAMILY HISTORY: Family History  Problem Relation Age of Onset   Cancer Mother        stomach   Colon cancer Mother    Anesthesia problems Neg Hx    Hypotension Neg Hx    Malignant hyperthermia Neg Hx    Pseudochol deficiency Neg Hx    Colon polyps Neg Hx    Esophageal cancer Neg Hx    Rectal cancer Neg Hx    Stomach cancer Neg Hx     Review of Systems  Constitutional:  Negative for appetite change, chills, fatigue, fever and unexpected weight change.  HENT:   Negative for hearing loss, lump/mass and trouble swallowing.   Eyes:  Negative for eye problems and icterus.  Respiratory:  Negative for chest tightness, cough and shortness of breath.   Cardiovascular:  Negative for chest pain, leg swelling and palpitations.  Gastrointestinal:  Negative for abdominal distention, abdominal pain, constipation, diarrhea, nausea and  vomiting.  Endocrine: Negative for hot flashes.  Genitourinary:  Negative for difficulty urinating.   Musculoskeletal:  Negative for arthralgias.  Skin:  Negative for itching and rash.  Neurological:  Negative for dizziness, extremity weakness, headaches and numbness.  Hematological:  Negative for adenopathy. Does not bruise/bleed easily.  Psychiatric/Behavioral:  Negative for depression. The patient is not nervous/anxious.       PHYSICAL EXAMINATION    Vitals:   08/26/23 1314  BP: 107/78  Pulse: 90  Resp: 16  Temp: 97.7 F (36.5 C)  SpO2: 100%    Physical Exam Constitutional:      General: She is not in acute distress.    Appearance: Normal appearance. She is not toxic-appearing.  HENT:     Head: Normocephalic and atraumatic.     Mouth/Throat:     Mouth: Mucous membranes are moist.     Pharynx: Oropharynx is clear. No oropharyngeal exudate or posterior oropharyngeal erythema.  Eyes:  General: No scleral icterus. Cardiovascular:     Rate and Rhythm: Normal rate and regular rhythm.     Pulses: Normal pulses.     Heart sounds: Normal heart sounds.  Pulmonary:     Effort: Pulmonary effort is normal.     Breath sounds: Normal breath sounds.  Chest:     Comments: Bilateral breasts s/p mastectomy and reconstruction, no sign of local recurrence.  Abdominal:     General: Abdomen is flat. Bowel sounds are normal. There is no distension.     Palpations: Abdomen is soft.     Tenderness: There is no abdominal tenderness.  Musculoskeletal:        General: No swelling.     Cervical back: Neck supple.  Lymphadenopathy:     Cervical: No cervical adenopathy.     Upper Body:     Right upper body: No axillary adenopathy.     Left upper body: No axillary adenopathy.  Skin:    General: Skin is warm and dry.     Findings: No rash.  Neurological:     General: No focal deficit present.     Mental Status: She is alert.  Psychiatric:        Mood and Affect: Mood normal.         Behavior: Behavior normal.        ASSESSMENT and THERAPY PLAN:   Breast cancer of upper-outer quadrant of left female breast ([facility]) Samantha Mckay is a 58 year old BRCA2 positive woman here today for f/u and evaluation of her history of stage IIA left sided breast cancer, ER/PR positive s/p bilateral mastectomies followed by adjuvant antiestrogen therapy with Anastrozole.  History of breast cancer: s/p bilateral mastectomies, no sign of local recurrence.  Continue with annual clinical breast exam.  BRCA 2 mutation: S/p bilateral mastectomies and BSO.  Due to FH of pancreatic cancer, she meets criteria for MRCP to screen for pancreatic cancer.  Reviewed risks and benefits and she would like to proceed.  Health maintenance: Recommended continued f/u with her PCP.  We discussed healthy diet and exercise today.  RTC in 1 year for continued long-term f/u.     All questions were answered. The patient knows to call the clinic with any problems, questions or concerns. We can certainly see the patient much sooner if necessary.  Total encounter time:20 minutes*in face-to-face visit time, chart review, lab review, care coordination, order entry, and documentation of the encounter time.    Lillard Anes, NP 08/30/23 8:35 AM Medical Oncology and Hematology [facility] [address] [place], [place] [address] Tel. [phone]    Fax. [phone]  *Total Encounter Time as defined by the Centers for Medicare and Medicaid Services includes, in addition to the face-to-face time of a patient visit (documented in the note above) non-face-to-face time: obtaining and reviewing outside history, ordering and reviewing medications, tests or procedures, care coordination (communications with other health care professionals or caregivers) and documentation in the medical record.

## 2023-10-08 ENCOUNTER — Other Ambulatory Visit: Payer: Medicaid Other

## 2023-10-09 ENCOUNTER — Ambulatory Visit
Admission: RE | Admit: 2023-10-09 | Discharge: 2023-10-09 | Disposition: A | Discharge status: Home / Self Care | Payer: Medicaid Other | Source: Ambulatory Visit | Attending: Adult Health | Admitting: Adult Health

## 2023-10-09 DIAGNOSIS — Z1501 Genetic susceptibility to malignant neoplasm of breast: Secondary | ICD-10-CM

## 2023-10-09 DIAGNOSIS — Z800 Family history of malignant neoplasm of digestive organs: Secondary | ICD-10-CM

## 2023-10-09 DIAGNOSIS — C50412 Malignant neoplasm of upper-outer quadrant of left female breast: Secondary | ICD-10-CM

## 2023-10-09 MED ORDER — GADOPICLENOL 0.5 MMOL/ML IV SOLN
7.5000 mL | Freq: Once | INTRAVENOUS | Status: AC | PRN
  Administered 2023-10-09: 7.5 mL via INTRAVENOUS

## 2023-10-20 ENCOUNTER — Telehealth: Payer: Self-pay | Admitting: Hematology and Oncology

## 2023-10-20 NOTE — Telephone Encounter (Signed)
 I left a voicemail for the patient that the MRI of the abdomen showed no evidence of pancreatic abnormalities.  She has very large cysts throughout the liver and nothing further is planned for that.

## 2023-11-14 ENCOUNTER — Other Ambulatory Visit: Payer: Self-pay

## 2023-11-14 ENCOUNTER — Emergency Department (HOSPITAL_BASED_OUTPATIENT_CLINIC_OR_DEPARTMENT_OTHER)
Admission: EM | Admit: 2023-11-14 | Discharge: 2023-11-14 | Disposition: A | Discharge status: Home / Self Care | Attending: Emergency Medicine | Admitting: Emergency Medicine

## 2023-11-14 ENCOUNTER — Emergency Department (HOSPITAL_BASED_OUTPATIENT_CLINIC_OR_DEPARTMENT_OTHER)

## 2023-11-14 ENCOUNTER — Encounter (HOSPITAL_BASED_OUTPATIENT_CLINIC_OR_DEPARTMENT_OTHER): Payer: Self-pay | Admitting: Emergency Medicine

## 2023-11-14 DIAGNOSIS — Z853 Personal history of malignant neoplasm of breast: Secondary | ICD-10-CM | POA: Insufficient documentation

## 2023-11-14 DIAGNOSIS — R1110 Vomiting, unspecified: Secondary | ICD-10-CM | POA: Diagnosis not present

## 2023-11-14 DIAGNOSIS — M545 Low back pain, unspecified: Secondary | ICD-10-CM | POA: Diagnosis present

## 2023-11-14 DIAGNOSIS — N200 Calculus of kidney: Secondary | ICD-10-CM | POA: Diagnosis not present

## 2023-11-14 LAB — CBC WITH DIFFERENTIAL/PLATELET
Abs Immature Granulocytes: 0.01 10*3/uL (ref 0.00–0.07)
Basophils Absolute: 0 10*3/uL (ref 0.0–0.1)
Basophils Relative: 0 %
Eosinophils Absolute: 0.1 10*3/uL (ref 0.0–0.5)
Eosinophils Relative: 1 %
HCT: 39.5 % (ref 36.0–46.0)
Hemoglobin: 12.5 g/dL (ref 12.0–15.0)
Immature Granulocytes: 0 %
Lymphocytes Relative: 41 %
Lymphs Abs: 1.9 10*3/uL (ref 0.7–4.0)
MCH: 26.4 pg (ref 26.0–34.0)
MCHC: 31.6 g/dL (ref 30.0–36.0)
MCV: 83.5 fL (ref 80.0–100.0)
Monocytes Absolute: 0.5 10*3/uL (ref 0.1–1.0)
Monocytes Relative: 10 %
Neutro Abs: 2.2 10*3/uL (ref 1.7–7.7)
Neutrophils Relative %: 48 %
Platelets: 221 10*3/uL (ref 150–400)
RBC: 4.73 MIL/uL (ref 3.87–5.11)
RDW: 13.2 % (ref 11.5–15.5)
WBC: 4.6 10*3/uL (ref 4.0–10.5)
nRBC: 0 % (ref 0.0–0.2)

## 2023-11-14 LAB — URINALYSIS, ROUTINE W REFLEX MICROSCOPIC
Bilirubin Urine: NEGATIVE
Glucose, UA: NEGATIVE mg/dL
Ketones, ur: NEGATIVE mg/dL
Nitrite: NEGATIVE
Protein, ur: 100 mg/dL — AB
Specific Gravity, Urine: 1.025 (ref 1.005–1.030)
pH: 5.5 (ref 5.0–8.0)

## 2023-11-14 LAB — COMPREHENSIVE METABOLIC PANEL WITH GFR
ALT: 16 U/L (ref 0–44)
AST: 27 U/L (ref 15–41)
Albumin: 3.6 g/dL (ref 3.5–5.0)
Alkaline Phosphatase: 89 U/L (ref 38–126)
Anion gap: 10 (ref 5–15)
BUN: 13 mg/dL (ref 6–20)
CO2: 24 mmol/L (ref 22–32)
Calcium: 9.1 mg/dL (ref 8.9–10.3)
Chloride: 105 mmol/L (ref 98–111)
Creatinine, Ser: 0.96 mg/dL (ref 0.44–1.00)
GFR, Estimated: >60 mL/min (ref >60)
Glucose, Bld: 100 mg/dL — ABNORMAL HIGH (ref 70–99)
Potassium: 3.7 mmol/L (ref 3.5–5.1)
Sodium: 139 mmol/L (ref 135–145)
Total Bilirubin: 0.8 mg/dL (ref 0.0–1.2)
Total Protein: 7.3 g/dL (ref 6.5–8.1)

## 2023-11-14 LAB — URINALYSIS, MICROSCOPIC (REFLEX): RBC / HPF: >50 RBC/hpf (ref 0–5)

## 2023-11-14 LAB — LIPASE, BLOOD: Lipase: 25 U/L (ref 11–51)

## 2023-11-14 MED ORDER — ONDANSETRON HCL 4 MG PO TABS: 4.0000 mg | ORAL_TABLET | Freq: Four times a day (QID) | ORAL | 0 refills | Status: AC

## 2023-11-14 MED ORDER — SODIUM CHLORIDE 0.9 % IV BOLUS
1000.0000 mL | Freq: Once | INTRAVENOUS | Status: AC
  Administered 2023-11-14: 1000 mL via INTRAVENOUS

## 2023-11-14 MED ORDER — IOHEXOL 300 MG/ML  SOLN
100.0000 mL | Freq: Once | INTRAMUSCULAR | Status: AC | PRN
  Administered 2023-11-14: 100 mL via INTRAVENOUS

## 2023-11-14 MED ORDER — OXYCODONE HCL 5 MG PO TABS: 5.0000 mg | ORAL_TABLET | Freq: Four times a day (QID) | ORAL | 0 refills | Status: AC | PRN

## 2023-11-14 MED ORDER — OXYCODONE-ACETAMINOPHEN 5-325 MG PO TABS
1.0000 | ORAL_TABLET | Freq: Once | ORAL | Status: AC
  Administered 2023-11-14: 1 via ORAL
  Filled 2023-11-14: qty 1

## 2023-11-14 MED ORDER — TAMSULOSIN HCL 0.4 MG PO CAPS: 0.4000 mg | ORAL_CAPSULE | Freq: Every day | ORAL | 0 refills | Status: AC

## 2023-11-14 NOTE — ED Provider Notes (Signed)
 [place] [facility] Provider Note   CSN: [id number] Arrival date & time: 11/14/23  1155     History  Chief Complaint  Patient presents with   Back Pain    Samantha Mckay  is a 58 y.o. female history of breast cancer status post mastectomy, salpingo oophorectomy, liver cysts presented with left low back pain that began yesterday when patient was painting.  Patient states that she thought she strained herself but noticed after she urinated she had darker urine and started to have left flank pain with a.  Patient states she had a few episodes of emesis with this as well.  Patient denies any fevers, chest pain, shortness of breath, abdominal pain, dysuria but states when she urinates she feels a sensation in her left flank 30 minutes later.  Patient be able to walk denies any saddle anesthesia, urinary incontinence, inability walk, paresthesias or new onset weakness.    Home Medications Prior to Admission medications   Medication Sig Start Date End Date Taking? Authorizing Provider  ondansetron  (ZOFRAN ) 4 MG tablet Take 1 tablet (4 mg total) by mouth every 6 (six) hours. 11/14/23  Yes Denese Finn, PA-C  oxyCODONE  (ROXICODONE ) 5 MG immediate release tablet Take 1 tablet (5 mg total) by mouth every 6 (six) hours as needed for up to 3 days for severe pain (pain score 7-10). 11/14/23 11/17/23 Yes Suann Klier, Arlin Benes, PA-C  tamsulosin  (FLOMAX ) 0.4 MG CAPS capsule Take 1 capsule (0.4 mg total) by mouth daily after breakfast for 10 days. 11/14/23 11/24/23 Yes Amerah Puleo, Arlin Benes, PA-C  acetaminophen  (TYLENOL ) 500 MG tablet Take by mouth.    [provider]  calcium carbonate (OS-CAL) 600 MG TABS Take 600 mg by mouth 2 (two) times daily with a meal.    [provider]  cholecalciferol (VITAMIN D ) 1000 UNITS tablet Take 1,000 Units by mouth 2 (two) times daily.    [provider]  Multiple Vitamin (MULTIVITAMIN) tablet Take 1 tablet by mouth  daily.    [provider]      Allergies    Patient has no known allergies.    Review of Systems   Review of Systems  Musculoskeletal:  Positive for back pain.    Physical Exam Updated Vital Signs BP 110/76 (BP Location: Left Arm)   Pulse 76   Temp 98 F (36.7 C)   Resp 12   Ht 5\' 1"  (1.549 m)   Wt 79.4 kg   SpO2 97%   BMI 33.07 kg/m  Physical Exam Constitutional:      General: She is not in acute distress. Cardiovascular:     Rate and Rhythm: Normal rate.     Pulses: Normal pulses.  Abdominal:     Palpations: Abdomen is soft.     Tenderness: There is no abdominal tenderness. There is left CVA tenderness. There is no right CVA tenderness, guarding or rebound.  Musculoskeletal:     Comments: No paraspinal musculature tenderness 5 out of 5 bilateral hip flexion No midline tenderness or abnormalities palpated  Skin:    General: Skin is warm and dry.     Capillary Refill: Capillary refill takes less than 2 seconds.  Neurological:     Mental Status: She is alert.     Comments: Sensation intact distally Able to ambulate without difficulty  Psychiatric:        Mood and Affect: Mood normal.     ED Results / Procedures / Treatments  Labs (all labs ordered are listed, but only abnormal results are displayed) Labs Reviewed  URINALYSIS, ROUTINE W REFLEX MICROSCOPIC - Abnormal; Notable for the following components:      Result Value   APPearance CLOUDY (*)    Hgb urine dipstick LARGE (*)    Protein, ur 100 (*)    Leukocytes,Ua TRACE (*)    All other components within normal limits  COMPREHENSIVE METABOLIC PANEL WITH GFR - Abnormal; Notable for the following components:   Glucose, Bld 100 (*)    All other components within normal limits  URINALYSIS, MICROSCOPIC (REFLEX) - Abnormal; Notable for the following components:   Bacteria, UA FEW (*)    All other components within normal limits  CBC WITH DIFFERENTIAL/PLATELET  LIPASE, BLOOD     EKG None  Radiology CT ABDOMEN PELVIS W CONTRAST Result Date: 11/14/2023 CLINICAL DATA:  Left flank pain.  Vomiting. EXAM: CT ABDOMEN AND PELVIS WITH CONTRAST TECHNIQUE: Multidetector CT imaging of the abdomen and pelvis was performed using the standard protocol following bolus administration of intravenous contrast. RADIATION DOSE REDUCTION: This exam was performed according to the departmental dose-optimization program which includes automated exposure control, adjustment of the mA and/or kV according to patient size and/or use of iterative reconstruction technique. CONTRAST:  OMNIPAQUE  IOHEXOL  300 MG/ML  SOLN COMPARISON:  MR abdomen without and with contrast 10/09/2023. FINDINGS: Lower chest: The lung bases are clear without focal nodule, mass, or airspace disease. Hepatobiliary: Extensive cystic changes of the liver are stable. No solid enhancing components are present. Common bile duct and gallbladder normal. Pancreas: Unremarkable. No pancreatic ductal dilatation or surrounding inflammatory changes. Spleen: Normal in size without focal abnormality. Adrenals/Urinary Tract: Adrenal glands are normal bilaterally. Mild dilation left renal collecting system is present. Fluid level is present on the delayed images. An obstructing 7 mm stone is present just past the UPJ. More distal left ureter is within normal limits. Right kidney is unremarkable. No stone or mass lesion is present. The right ureter is within normal limits. The urinary bladder is normal. The urinary bladder is within normal limits. Stomach/Bowel: The stomach and duodenum are within normal limits. Small bowel is unremarkable. Terminal ileum is within normal limits. The appendix is visualized and normal. The ascending and transverse colon are normal. Descending and sigmoid colon are normal. Vascular/Lymphatic: No significant vascular findings are present. No enlarged abdominal or pelvic lymph nodes. Reproductive: Uterus and bilateral  adnexa are unremarkable. Other: No abdominal wall hernia or abnormality. No abdominopelvic ascites. Musculoskeletal: Grade 1 degenerative anterolisthesis at L4-5 secondary to facet degenerative change. No other significant listhesis is present. Normal lumbar lordosis is present. Mild leftward curvature is present at thoracolumbar junction. No focal osseous lesions are present. Bony pelvis is within normal limits. The hips are located and normal bilaterally. IMPRESSION: 1. Obstructing 7 mm stone just past the left UPJ with mild dilation of the left renal collecting system. 2. Extensive cystic changes of the liver are stable. No solid enhancing components are present. 3. Grade 1 degenerative anterolisthesis at L4-5 secondary to facet degenerative change. Electronically Signed   By: Audree Leas M.D.   On: 11/14/2023 14:33    Procedures Procedures    Medications Ordered in ED Medications  oxyCODONE -acetaminophen  (PERCOCET/ROXICET) 5-325 MG per tablet 1 tablet (1 tablet Oral Given 11/14/23 1222)  iohexol  (OMNIPAQUE ) 300 MG/ML solution 100 mL (100 mLs Intravenous Contrast Given 11/14/23 1312)  sodium chloride  0.9 % bolus 1,000 mL (0 mLs Intravenous Stopped 11/14/23 1427)  ED Course/ Medical Decision Making/ A&P                                 Medical Decision Making Amount and/or Complexity of Data Reviewed Labs: ordered. Radiology: ordered.  Risk Prescription drug management.   Sravya Ventresca  58 y.o. presented today for back pain. Working DDx that I considered at this time includes, but not limited to, nephrolithiasis, MSK, underlying fracture, epidural hematoma/abscess, cauda equina syndrome, spinal stenosis, spinal malignancy, discitis, spinal infection, spondylitises/ spondylosis, conus medullaris, DDD of the back.  R/o DDx: MSK, underlying fracture, epidural hematoma/abscess, cauda equina syndrome, spinal stenosis, spinal malignancy, discitis, spinal infection, spondylitises/  spondylosis, conus medullaris, DDD of the back : less likely due to history of present illness, physical exam, labs/imaging findings.  Review of prior external notes: 05/18/2021 office visit  Unique Tests and My Interpretation:  CBC: Unremarkable CMP: Unremarkable Lipase: Unremarkable UA: Few bacteria with large hemoglobin CT abdomen pelvis with contrast: 7 mm stone, no new cystic changes  Social Determinants of Health: none  Discussion with Independent Historian:  Son  Discussion of Management of Tests: None  Risk: Medium: prescription drug management  Risk Stratification Score: None  Plan: On exam patient was no acute distress with stable vitals.  On exam patient has left CVA tenderness and does endorse some urinary symptoms which after she urinates she gets left flank pain suspicious of possible UTI versus pyelonephritis.  Abdomen soft nontender and patient able to ambulate without difficulty and so I do not believe this to be a spinal cord issue but do feel this is more urinary based.  Patient was endorsing some vomiting as well with this and so we will get labs and imaging as patient did have recent CT scan that does show a liver cyst as well and does have complicated history involving breast cancer.  Patient has no midline tenderness either or weakness on exam and is neurovascularly intact.  Labs and imaging do show 7 mm stone causing patient's symptoms.  Discussed with patient urology follow-up and will prescribe Flomax  along with oxycodone  and Zofran .  I discussed with the patient do not operate machinery or drive after using the oxycodone  as it can make her drowsy which she verbalized understanding of.  I recommend that she takes Tylenol  every 6 hours needed for pain and to follow-up with urology and drink plenty of fluids.  At time of discharge patient is resting comfortably and pain is well-controlled.  Patient was given return precautions. Patient stable for discharge at this  time.  Patient verbalized understanding of plan.  This chart was dictated using voice recognition software.  Despite best efforts to proofread,  errors can occur which can change the documentation meaning.        Final Clinical Impression(s) / ED Diagnoses Final diagnoses:  Nephrolithiasis    Rx / DC Orders ED Discharge Orders          Ordered    tamsulosin  (FLOMAX ) 0.4 MG CAPS capsule  Daily after breakfast        11/14/23 1438    oxyCODONE  (ROXICODONE ) 5 MG immediate release tablet  Every 6 hours PRN        11/14/23 1438    ondansetron  (ZOFRAN ) 4 MG tablet  Every 6 hours        11/14/23 1438              Ersel Wadleigh, Arlin Benes, PA-C  11/14/23 1442    Albertus Hughs, DO 11/14/23 1456

## 2023-11-14 NOTE — ED Notes (Signed)
 ED Provider at bedside.

## 2023-11-14 NOTE — Discharge Instructions (Addendum)
 Today your labs and imaging show you have a kidney stone causing your symptoms.  Your CT scan shows you have a 7 mm stone causing your symptoms. Currently, the symptoms are under control, and so we can safely discharge you. Most of the stones pass on their own, and we need to just control the pain. You may take Tylenol  or ibuprofen every 6 hours as needed for pain.  I have also prescribed you pain medication for pain not controlled by this. Please pick up the medications I have prescribed you, including the pain medicine. Call the Urologist for an appointment, if the pain continues - even if it is tolerable. Come to the ER if the pain is intolerable. Also come back to the ER if there are fevers, chills, inability to keep any fluids down, confusion, large blood clots passing.  Take Oxycodone  as prescribed. Do not drink alcohol, drive or participate in any other potentially dangerous activities while taking this medication as it may make you sleepy. Do not take this medication with any other sedating medications, either prescription or over-the-counter. If you were prescribed Percocet or Vicodin, do not take these with acetaminophen  (Tylenol ) as it is already contained within these medications.   This medication is an opiate (or narcotic) pain medication and can be habit forming.  Use it as little as possible to achieve adequate pain control.  Do not use or use it with extreme caution if you have a history of opiate abuse or dependence.  If you are on a pain contract with your primary care doctor or a pain specialist, be sure to let them know you were prescribed this medication today from the [facility] Emergency Department.  This medication is intended for your use only - do not give any to anyone else and keep it in a secure place where nobody else, especially children, have access to it.  It will also cause or worsen constipation, so you may want to consider taking an over-the-counter stool  softener while you are taking this medication.

## 2023-11-14 NOTE — ED Triage Notes (Signed)
 Pt c/o low back pain; had some vomiting yesterday and today; reports dark urine

## 2023-11-14 NOTE — ED Notes (Signed)
 Patient transported to CT

## 2023-11-15 ENCOUNTER — Emergency Department (HOSPITAL_BASED_OUTPATIENT_CLINIC_OR_DEPARTMENT_OTHER)
Admission: EM | Admit: 2023-11-15 | Discharge: 2023-11-15 | Disposition: A | Discharge status: Home / Self Care | Attending: Emergency Medicine | Admitting: Emergency Medicine

## 2023-11-15 ENCOUNTER — Encounter (HOSPITAL_BASED_OUTPATIENT_CLINIC_OR_DEPARTMENT_OTHER): Payer: Self-pay | Admitting: Emergency Medicine

## 2023-11-15 ENCOUNTER — Other Ambulatory Visit: Payer: Self-pay

## 2023-11-15 DIAGNOSIS — R220 Localized swelling, mass and lump, head: Secondary | ICD-10-CM | POA: Diagnosis present

## 2023-11-15 DIAGNOSIS — R609 Edema, unspecified: Secondary | ICD-10-CM

## 2023-11-15 LAB — GROUP A STREP BY PCR: Group A Strep by PCR: NOT DETECTED

## 2023-11-15 MED ORDER — DIPHENHYDRAMINE HCL 25 MG PO CAPS
25.0000 mg | ORAL_CAPSULE | Freq: Once | ORAL | Status: AC
  Administered 2023-11-15: 25 mg via ORAL
  Filled 2023-11-15: qty 1

## 2023-11-15 MED ORDER — PROMETHAZINE HCL 25 MG PO TABS: 25.0000 mg | ORAL_TABLET | Freq: Four times a day (QID) | ORAL | 0 refills | Status: AC | PRN

## 2023-11-15 MED ORDER — LIDOCAINE VISCOUS HCL 2 % MT SOLN: 15.0000 mL | Freq: Once | OROMUCOSAL | Status: DC

## 2023-11-15 MED ORDER — EPINEPHRINE 0.3 MG/0.3ML IJ SOAJ: 0.3000 mg | INTRAMUSCULAR | 0 refills | Status: AC | PRN

## 2023-11-15 MED ORDER — DEXAMETHASONE 4 MG PO TABS
10.0000 mg | ORAL_TABLET | Freq: Once | ORAL | Status: AC
  Administered 2023-11-15: 10 mg via ORAL
  Filled 2023-11-15: qty 3

## 2023-11-15 NOTE — ED Triage Notes (Addendum)
 Reports feeling swelling to her throat , no pain , no resp distress . Was seen yesterday for kidney stone , took Flomax  yesterday , unknown allergen and not sure if this is an allergic reaction , no rash

## 2023-11-15 NOTE — Discharge Instructions (Signed)
 As discussed, your workup today was overall reassuring.  You tested negative for group A strep.  Will recommend stopping the medicines that you took this morning in the form of Flomax  and Zofran  given some concern for allergic reaction.  Will send a different medicine in for nausea.  Will also send in an EpiPen  to use if symptoms concerning for anaphylaxis occur; return if these develop any use of the EpiPen .  You may continue to take antihistamine such as Zyrtec/Claritin/Allegra for your symptoms.  Please do not hesitate to return if the worrisome signs and symptoms we discussed become apparent.

## 2023-11-15 NOTE — ED Notes (Signed)
 Pt discharged before viscous lidocaine  was administered. EDP notified

## 2023-11-15 NOTE — ED Provider Notes (Signed)
 [facility] Provider Note   CSN: [id number] Arrival date & time: 11/15/23  1424     History  Chief Complaint  Patient presents with   Oral Swelling    Samantha Mckay  is a 58 y.o. female.  HPI   58 year old female presents emergency department with complaints of swelling in her throat.  States that she has had the symptoms upon awakening this morning.  States that symptoms have been stable since onset without worsening or new better.  Was recently seen in the ED yesterday with concern for Flomax .  At that time was given Percocet.  States that she went home and pick up Zofran , Flomax  as well as oxycodone .  Took the Flomax  as well as Zofran  last night.  Denies any difficulty breathing/swallowing, feelings of throat closing on her.  Denies any rash, chest pain, shortness of breath, abdominal pain, nausea, vomiting.  Denies history of known allergies.  States it does not hurt to swallow.  Past medical history significant for breast cancer, nephrolithiasis  Home Medications Prior to Admission medications   Medication Sig Start Date End Date Taking? Authorizing Provider  EPINEPHrine  0.3 mg/0.3 mL IJ SOAJ injection Inject 0.3 mg into the muscle as needed for anaphylaxis. 11/15/23  Yes Neil Balls A, PA  promethazine  (PHENERGAN ) 25 MG tablet Take 1 tablet (25 mg total) by mouth every 6 (six) hours as needed for nausea or vomiting. 11/15/23  Yes Neil Balls A, PA  acetaminophen  (TYLENOL ) 500 MG tablet Take by mouth.    [provider]  calcium carbonate (OS-CAL) 600 MG TABS Take 600 mg by mouth 2 (two) times daily with a meal.    [provider]  cholecalciferol (VITAMIN D ) 1000 UNITS tablet Take 1,000 Units by mouth 2 (two) times daily.    [provider]  Multiple Vitamin (MULTIVITAMIN) tablet Take 1 tablet by mouth daily.    [provider]  ondansetron  (ZOFRAN ) 4 MG tablet Take 1 tablet (4 mg total)  by mouth every 6 (six) hours. 11/14/23   Denese Finn, PA-C  oxyCODONE  (ROXICODONE ) 5 MG immediate release tablet Take 1 tablet (5 mg total) by mouth every 6 (six) hours as needed for up to 3 days for severe pain (pain score 7-10). 11/14/23 11/17/23  Denese Finn, PA-C  tamsulosin  (FLOMAX ) 0.4 MG CAPS capsule Take 1 capsule (0.4 mg total) by mouth daily after breakfast for 10 days. 11/14/23 11/24/23  Denese Finn, PA-C      Allergies    Patient has no known allergies.    Review of Systems   Review of Systems  All other systems reviewed and are negative.   Physical Exam Updated Vital Signs BP 130/80   Pulse 84   Temp 97.9 F (36.6 C) (Oral)   Resp 18   SpO2 100%  Physical Exam Vitals and nursing note reviewed.  Constitutional:      General: She is not in acute distress.    Appearance: She is well-developed.  HENT:     Head: Normocephalic and atraumatic.     Mouth/Throat:     Comments: No posterior pharyngeal erythema.  Uvula midline especially phonation.  No sublingual swelling.  Tenderness as well as swelling noted posteriorly just inferior to angle of mandible.  Area symmetric.  No overlying erythema, palpable fluctuance/induration.  Tonsils 0, 1+ bilaterally without exudate. Eyes:     Conjunctiva/sclera: Conjunctivae normal.  Cardiovascular:     Rate and Rhythm: Normal rate  and regular rhythm.     Heart sounds: No murmur heard. Pulmonary:     Effort: Pulmonary effort is normal. No respiratory distress.     Breath sounds: Normal breath sounds. No stridor. No wheezing, rhonchi or rales.  Abdominal:     Palpations: Abdomen is soft.     Tenderness: There is no abdominal tenderness.  Musculoskeletal:        General: No swelling.     Cervical back: Neck supple.  Skin:    General: Skin is warm and dry.     Capillary Refill: Capillary refill takes less than 2 seconds.  Neurological:     Mental Status: She is alert.  Psychiatric:        Mood and Affect: Mood normal.      ED Results / Procedures / Treatments   Labs (all labs ordered are listed, but only abnormal results are displayed) Labs Reviewed  GROUP A STREP BY PCR    EKG None  Radiology CT ABDOMEN PELVIS W CONTRAST Result Date: 11/14/2023 CLINICAL DATA:  Left flank pain.  Vomiting. EXAM: CT ABDOMEN AND PELVIS WITH CONTRAST TECHNIQUE: Multidetector CT imaging of the abdomen and pelvis was performed using the standard protocol following bolus administration of intravenous contrast. RADIATION DOSE REDUCTION: This exam was performed according to the departmental dose-optimization program which includes automated exposure control, adjustment of the mA and/or kV according to patient size and/or use of iterative reconstruction technique. CONTRAST:  OMNIPAQUE  IOHEXOL  300 MG/ML  SOLN COMPARISON:  MR abdomen without and with contrast 10/09/2023. FINDINGS: Lower chest: The lung bases are clear without focal nodule, mass, or airspace disease. Hepatobiliary: Extensive cystic changes of the liver are stable. No solid enhancing components are present. Common bile duct and gallbladder normal. Pancreas: Unremarkable. No pancreatic ductal dilatation or surrounding inflammatory changes. Spleen: Normal in size without focal abnormality. Adrenals/Urinary Tract: Adrenal glands are normal bilaterally. Mild dilation left renal collecting system is present. Fluid level is present on the delayed images. An obstructing 7 mm stone is present just past the UPJ. More distal left ureter is within normal limits. Right kidney is unremarkable. No stone or mass lesion is present. The right ureter is within normal limits. The urinary bladder is normal. The urinary bladder is within normal limits. Stomach/Bowel: The stomach and duodenum are within normal limits. Small bowel is unremarkable. Terminal ileum is within normal limits. The appendix is visualized and normal. The ascending and transverse colon are normal. Descending and sigmoid  colon are normal. Vascular/Lymphatic: No significant vascular findings are present. No enlarged abdominal or pelvic lymph nodes. Reproductive: Uterus and bilateral adnexa are unremarkable. Other: No abdominal wall hernia or abnormality. No abdominopelvic ascites. Musculoskeletal: Grade 1 degenerative anterolisthesis at L4-5 secondary to facet degenerative change. No other significant listhesis is present. Normal lumbar lordosis is present. Mild leftward curvature is present at thoracolumbar junction. No focal osseous lesions are present. Bony pelvis is within normal limits. The hips are located and normal bilaterally. IMPRESSION: 1. Obstructing 7 mm stone just past the left UPJ with mild dilation of the left renal collecting system. 2. Extensive cystic changes of the liver are stable. No solid enhancing components are present. 3. Grade 1 degenerative anterolisthesis at L4-5 secondary to facet degenerative change. Electronically Signed   By: Audree Leas M.D.   On: 11/14/2023 14:33    Procedures Procedures    Medications Ordered in ED Medications  lidocaine  (XYLOCAINE ) 2 % viscous mouth solution 15 mL (15 mLs Mouth/Throat Not Given  11/15/23 1622)  dexamethasone  (DECADRON ) tablet 10 mg (10 mg Oral Given 11/15/23 1531)  diphenhydrAMINE  (BENADRYL ) capsule 25 mg (25 mg Oral Given 11/15/23 1530)    ED Course/ Medical Decision Making/ A&P                                 Medical Decision Making Risk Prescription drug management.   This patient presents to the ED for concern of leg swelling, this involves an extensive number of treatment options, and is a complaint that carries with it a high risk of complications and morbidity.  The differential diagnosis includes pharyngitis, abscess, sialoadenitis, sialolithiasis, allergic reaction, retropharyngeal abscess, PTA, other   Co morbidities that complicate the patient evaluation  See HPI   Additional history obtained:  Additional history  obtained from EMR External records from outside source obtained and reviewed including hospital records   Lab Tests:  I Ordered, and personally interpreted labs.  The pertinent results include: Group A strep negative   Imaging Studies ordered:  N/a   Cardiac Monitoring: / EKG:  N/a   Consultations Obtained:  I requested consultation with the attending Dr. Takemoto who is in agreement with treatment plan going forward.   Problem List / ED Course / Critical interventions / Medication management  Swelling I ordered medication including Decadron , Benadryl   Reevaluation of the patient after these medicines showed that the patient improved I have reviewed the patients home medicines and have made adjustments as needed   Social Determinants of Health:  Denies tobacco, licit drug use.   Test / Admission - Considered:  Swelling of throat Vitals signs within normal range and stable throughout visit. Laboratory/imaging studies significant for: see above 58 year old female presents emergency department with complaints of swelling in her throat.  States that she has had the symptoms upon awakening this morning.  States that symptoms have been stable since onset without worsening or new better.  Was recently seen in the ED yesterday with concern for Flomax .  At that time was given Percocet.  States that she went home and pick up Zofran , Flomax  as well as oxycodone .  Took the Flomax  as well as Zofran  last night.  Denies any difficulty breathing/swallowing, feelings of throat closing on her.  Denies any rash, chest pain, shortness of breath, abdominal pain, nausea, vomiting.  Denies history of known allergies.  States it does not hurt to swallow. On exam, slight swelling angle of mandible bilaterally with overlying tenderness.  Auscultatory stridor.  No other appreciable abnormality on exam.  Group A strep performed at triage staff of which is negative.  Unsure of exact etiology of  patient's symptoms but some concern there could be allergic component given beginning of new medications yesterday in the form of Zofran , Flomax  versus inflammatory process.  Low suspicion for abscess formation or lithiasis given symmetric bilateral nature of symptoms.  Treated with Decadron , antihistamine while in ED.  Recommend abstinence from medications begun yesterday as well as continued use of antihistamines in the outpatient setting for patient's symptoms.  Patient given EpiPen  to use if symptoms concerning for anaphylaxis/angioedema occur with strict return precautions.  Treatment plan discussed with patient and she acknowledged understanding was agreeable to said plan.  Patient overall well-appearing, afebrile in no acute distress. Worrisome signs and symptoms were discussed with the patient, and the patient acknowledged understanding to return to the ED if noticed. Patient was stable upon discharge.  Final Clinical Impression(s) / ED Diagnoses Final diagnoses:  None    Rx / DC Orders ED Discharge Orders          Ordered    EPINEPHrine  0.3 mg/0.3 mL IJ SOAJ injection  As needed        11/15/23 1602    promethazine  (PHENERGAN ) 25 MG tablet  Every 6 hours PRN        11/15/23 1604               Butter, [place] 11/15/23 1637    Tegeler, Marine Sia, MD 11/15/23 [phone]

## 2023-11-19 ENCOUNTER — Telehealth: Payer: Self-pay | Admitting: *Deleted

## 2023-11-19 NOTE — Telephone Encounter (Signed)
 Received VM from pt stating she was recently diagnosed with a kidney stone and had an allergic reaction to the medication prescribed and was advised to f/u with MD.  After investigation, pt was seen in ED for evaluation and tx.  RN attempt x1 to return call to pt.  No answer, LVM for pt to f/u with pcp for treatment of kidney stone and if pt does not have a pcp, she will need to go to urgent care for evaluation and tx.

## 2023-12-04 ENCOUNTER — Encounter (HOSPITAL_BASED_OUTPATIENT_CLINIC_OR_DEPARTMENT_OTHER): Payer: Self-pay

## 2023-12-04 ENCOUNTER — Emergency Department (HOSPITAL_BASED_OUTPATIENT_CLINIC_OR_DEPARTMENT_OTHER)

## 2023-12-04 ENCOUNTER — Other Ambulatory Visit: Payer: Self-pay

## 2023-12-04 ENCOUNTER — Emergency Department (HOSPITAL_BASED_OUTPATIENT_CLINIC_OR_DEPARTMENT_OTHER)
Admission: EM | Admit: 2023-12-04 | Discharge: 2023-12-04 | Disposition: A | Discharge status: Home / Self Care | Attending: Emergency Medicine | Admitting: Emergency Medicine

## 2023-12-04 DIAGNOSIS — R319 Hematuria, unspecified: Secondary | ICD-10-CM | POA: Diagnosis present

## 2023-12-04 DIAGNOSIS — N200 Calculus of kidney: Secondary | ICD-10-CM

## 2023-12-04 DIAGNOSIS — N202 Calculus of kidney with calculus of ureter: Secondary | ICD-10-CM | POA: Diagnosis not present

## 2023-12-04 DIAGNOSIS — Z853 Personal history of malignant neoplasm of breast: Secondary | ICD-10-CM | POA: Insufficient documentation

## 2023-12-04 LAB — CBC WITH DIFFERENTIAL/PLATELET
Abs Immature Granulocytes: 0.01 10*3/uL (ref 0.00–0.07)
Basophils Absolute: 0 10*3/uL (ref 0.0–0.1)
Basophils Relative: 1 %
Eosinophils Absolute: 0.1 10*3/uL (ref 0.0–0.5)
Eosinophils Relative: 1 %
HCT: 40.2 % (ref 36.0–46.0)
Hemoglobin: 12.8 g/dL (ref 12.0–15.0)
Immature Granulocytes: 0 %
Lymphocytes Relative: 40 %
Lymphs Abs: 1.6 10*3/uL (ref 0.7–4.0)
MCH: 26.7 pg (ref 26.0–34.0)
MCHC: 31.8 g/dL (ref 30.0–36.0)
MCV: 83.8 fL (ref 80.0–100.0)
Monocytes Absolute: 0.3 10*3/uL (ref 0.1–1.0)
Monocytes Relative: 8 %
Neutro Abs: 2 10*3/uL (ref 1.7–7.7)
Neutrophils Relative %: 50 %
Platelets: 221 10*3/uL (ref 150–400)
RBC: 4.8 MIL/uL (ref 3.87–5.11)
RDW: 13.2 % (ref 11.5–15.5)
WBC: 4 10*3/uL (ref 4.0–10.5)
nRBC: 0 % (ref 0.0–0.2)

## 2023-12-04 LAB — URINALYSIS, ROUTINE W REFLEX MICROSCOPIC
Bilirubin Urine: NEGATIVE
Glucose, UA: NEGATIVE mg/dL
Ketones, ur: NEGATIVE mg/dL
Leukocytes,Ua: NEGATIVE
Nitrite: NEGATIVE
Protein, ur: NEGATIVE mg/dL
Specific Gravity, Urine: 1.025 (ref 1.005–1.030)
pH: 5.5 (ref 5.0–8.0)

## 2023-12-04 LAB — COMPREHENSIVE METABOLIC PANEL WITH GFR
ALT: 12 U/L (ref 0–44)
AST: 21 U/L (ref 15–41)
Albumin: 4.1 g/dL (ref 3.5–5.0)
Alkaline Phosphatase: 99 U/L (ref 38–126)
Anion gap: 9 (ref 5–15)
BUN: 11 mg/dL (ref 6–20)
CO2: 26 mmol/L (ref 22–32)
Calcium: 9.3 mg/dL (ref 8.9–10.3)
Chloride: 107 mmol/L (ref 98–111)
Creatinine, Ser: 0.88 mg/dL (ref 0.44–1.00)
GFR, Estimated: >60 mL/min (ref >60)
Glucose, Bld: 98 mg/dL (ref 70–99)
Potassium: 4.4 mmol/L (ref 3.5–5.1)
Sodium: 141 mmol/L (ref 135–145)
Total Bilirubin: 0.4 mg/dL (ref 0.0–1.2)
Total Protein: 7.4 g/dL (ref 6.5–8.1)

## 2023-12-04 LAB — URINALYSIS, MICROSCOPIC (REFLEX)

## 2023-12-04 LAB — LIPASE, BLOOD: Lipase: 22 U/L (ref 11–51)

## 2023-12-04 MED ORDER — OXYCODONE HCL 5 MG PO TABS: 5.0000 mg | ORAL_TABLET | ORAL | 0 refills | Status: AC | PRN

## 2023-12-04 NOTE — ED Triage Notes (Signed)
 Pt reports lower abdominal pain yesterday and when urinated noticed blood. Pain lower pelvic after urination.Pt has been drinking cranberry juice and no pain or blood this morning.

## 2023-12-04 NOTE — Discharge Instructions (Signed)
 Your symptom is due to a 7mm kidney stone on the left side.  Please follow-up with urologist for outpatient evaluation and management of your condition.  You may return if your condition worsen.  You may take opiate pain medication as needed for pain but be aware it can cause drowsiness.

## 2023-12-04 NOTE — ED Notes (Signed)

## 2023-12-04 NOTE — ED Provider Notes (Signed)
 [place] [facility] Provider Note   CSN: [id number] Arrival date & time: 12/04/23  6010     History  Chief Complaint  Patient presents with   Hematuria    Samantha Mckay  is a 58 y.o. female.  The history is provided by the patient and medical records. No language interpreter was used.  Hematuria     58 year old female history of metastatic breast cancer, BRCA2 positive presenting with complaint of abdominal pain and blood in her urine.  Patient reports yesterday she noted some lower abdominal cramping and when she urinated she noticed some blood in her urine.  Each time she urinates she noticed small amount of blood.  She does not endorse any significant flank pain or back pain she denies any burning urination and denies urgency or frequency.  She did try drinking some cranberry juice.  She denies having nausea vomiting or diarrhea and no vaginal bleeding.  She was seen several weeks ago for lower back pain and was found to have an obstructive kidney stone on CT scan.  She was given some medication and her symptoms resolved.  Home Medications Prior to Admission medications   Medication Sig Start Date End Date Taking? Authorizing Provider  acetaminophen  (TYLENOL ) 500 MG tablet Take by mouth.    [provider]  calcium carbonate (OS-CAL) 600 MG TABS Take 600 mg by mouth 2 (two) times daily with a meal.    [provider]  cholecalciferol (VITAMIN D ) 1000 UNITS tablet Take 1,000 Units by mouth 2 (two) times daily.    [provider]  EPINEPHrine  0.3 mg/0.3 mL IJ SOAJ injection Inject 0.3 mg into the muscle as needed for anaphylaxis. 11/15/23   [place] Butter, PA  Multiple Vitamin (MULTIVITAMIN) tablet Take 1 tablet by mouth daily.    [provider]  ondansetron  (ZOFRAN ) 4 MG tablet Take 1 tablet (4 mg total) by mouth every 6 (six) hours. 11/14/23   Denese Finn, PA-C  promethazine  (PHENERGAN ) 25 MG  tablet Take 1 tablet (25 mg total) by mouth every 6 (six) hours as needed for nausea or vomiting. 11/15/23   [place] Butter, PA      Allergies    Patient has no known allergies.    Review of Systems   Review of Systems  Genitourinary:  Positive for hematuria.  All other systems reviewed and are negative.   Physical Exam Updated Vital Signs BP 109/80 (BP Location: Right Arm)   Pulse 80   Temp 97.8 F (36.6 C) (Oral)   Resp 17   Ht 5\' 1"  (1.549 m)   Wt 81.2 kg   SpO2 100%   BMI 33.82 kg/m  Physical Exam Vitals and nursing note reviewed.  Constitutional:      General: She is not in acute distress.    Appearance: She is well-developed.  HENT:     Head: Atraumatic.  Eyes:     Conjunctiva/sclera: Conjunctivae normal.  Cardiovascular:     Rate and Rhythm: Normal rate and regular rhythm.     Pulses: Normal pulses.     Heart sounds: Normal heart sounds.  Pulmonary:     Effort: Pulmonary effort is normal.  Abdominal:     Palpations: Abdomen is soft.     Tenderness: There is no abdominal tenderness. There is no right CVA tenderness or left CVA tenderness.  Musculoskeletal:     Cervical back: Neck supple.  Skin:    Findings: No rash.  Neurological:  Mental Status: She is alert.  Psychiatric:        Mood and Affect: Mood normal.     ED Results / Procedures / Treatments   Labs (all labs ordered are listed, but only abnormal results are displayed) Labs Reviewed  URINALYSIS, ROUTINE W REFLEX MICROSCOPIC - Abnormal; Notable for the following components:      Result Value   Hgb urine dipstick MODERATE (*)    All other components within normal limits  URINALYSIS, MICROSCOPIC (REFLEX) - Abnormal; Notable for the following components:   Bacteria, UA FEW (*)    All other components within normal limits  CBC WITH DIFFERENTIAL/PLATELET  COMPREHENSIVE METABOLIC PANEL WITH GFR  LIPASE, BLOOD    EKG None  Radiology CT RENAL STONE STUDY Result Date:  12/04/2023 CLINICAL DATA:  Abdominal pain/flank pain. Evaluate for kidney stone. EXAM: CT ABDOMEN AND PELVIS WITHOUT CONTRAST TECHNIQUE: Multidetector CT imaging of the abdomen and pelvis was performed following the standard protocol without IV contrast. RADIATION DOSE REDUCTION: This exam was performed according to the departmental dose-optimization program which includes automated exposure control, adjustment of the mA and/or kV according to patient size and/or use of iterative reconstruction technique. COMPARISON:  CT from 11/14/2023. FINDINGS: Lower chest: Calcified granuloma in the left base. No acute abnormality. Hepatobiliary: Again seen is a polycystic liver. This was evaluated in detail by MRI from 10/09/2023. The dominant cyst is in the right lobe measuring 13.6 x 11.8 cm, image 9/2. The gallbladder appears normal. No bile duct dilatation. Pancreas: Unremarkable. No pancreatic ductal dilatation or surrounding inflammatory changes. Spleen: Normal in size without focal abnormality. Adrenals/Urinary Tract: Normal adrenal glands. Right kidney appears normal. No right kidney stones or obstructive uropathy. Left pelvocaliectasis and hydroureter noted. Within the distal left ureter just before the bladder there is a stone which measures 0.7 x 0.4 cm, image 64/2. Urinary bladder appears normal. Stomach/Bowel: Stomach is within normal limits. No pathologic dilatation of the large or small bowel loops. The appendix is visualized and appears normal. Colonic diverticulosis. No signs of acute diverticulitis. Vascular/Lymphatic: Normal appearance of the abdominal aorta. No signs of abdominopelvic adenopathy. Reproductive: Uterus and bilateral adnexa are unremarkable. Other: No free fluid or fluid collections. Musculoskeletal: No acute or suspicious osseous findings. Unchanged first degree anterolisthesis of L4 on L5. Bilateral facet arthropathy. IMPRESSION: 1. Left pelvocaliectasis and hydroureter noted. Within the  distal left ureter there is a stone which measures 0.7 x 0.4 cm. 2. Polycystic liver. See MRI report from 10/09/2023 for more details. 3. Colonic diverticulosis without signs of acute diverticulitis. Electronically Signed   By: Kimberley Penman M.D.   On: 12/04/2023 12:34    Procedures Procedures    Medications Ordered in ED Medications - No data to display  ED Course/ Medical Decision Making/ A&P                                 Medical Decision Making Amount and/or Complexity of Data Reviewed Labs: ordered. Radiology: ordered.   BP 109/80 (BP Location: Right Arm)   Pulse 80   Temp 97.8 F (36.6 C) (Oral)   Resp 17   Ht 5\' 1"  (1.549 m)   Wt 81.2 kg   SpO2 100%   BMI 33.82 kg/m   64:105 AM   58 year old female history of metastatic breast cancer, BRCA2 positive presenting with complaint of abdominal pain and blood in her urine.  Patient reports yesterday she noted  some lower abdominal cramping and when she urinated she noticed some blood in her urine.  Each time she urinates she noticed small amount of blood.  She does not endorse any significant flank pain or back pain she denies any burning urination and denies urgency or frequency.  She did try drinking some cranberry juice.  She denies having nausea vomiting or diarrhea and no vaginal bleeding.  She was seen several weeks ago for lower back pain and was found to have an obstructive kidney stone on CT scan.  She was given some medication and her symptoms resolved.  Exam overall reassuring no significant abdominal tenderness or CVA tenderness.  I suspect symptoms may be due to the previous kidney stone therefore I will obtain repeat abdominal pelvis CT scan for further assessment.  -Labs ordered, independently viewed and interpreted by me.  Labs remarkable for urinalysis with moderate hemoglobin on urine dip 6 but no signs of infection.  Labs otherwise reassuring. -The patient was maintained on a cardiac monitor.  I personally  viewed and interpreted the cardiac monitored which showed an underlying rhythm of: Sinus rhythm -Imaging independently viewed and interpreted by me and I agree with radiologist's interpretation.  Result remarkable for CT scan of the abdomen pelvis demonstrate a left ureteral stone measuring 7 mm noted  -This patient presents to the ED for concern of hematuria, this involves an extensive number of treatment options, and is a complaint that carries with it a high risk of complications and morbidity.  The differential diagnosis includes kidney stone, pyelonephritis, renal cell carcinoma, nephrolithiasis, nephritis, UTI -Co morbidities that complicate the patient evaluation includes breast cancer -Treatment includes monitoring -Reevaluation of the patient after these medicines showed that the patient resolved -PCP office notes or outside notes reviewed -Escalation to admission/observation considered: patients feels much better, is comfortable with discharge, and will follow up with urology -Prescription medication considered, patient comfortable with percocet -Social Determinant of Health considered  Patient is allergic to Flomax .  Will prescribe Percocet to have available as needed for pain.       Final Clinical Impression(s) / ED Diagnoses Final diagnoses:  Kidney stone on left side    Rx / DC Orders ED Discharge Orders          Ordered    oxyCODONE  (ROXICODONE ) 5 MG immediate release tablet  Every 4 hours PRN        12/04/23 1309              Debbra Fairy, PA-C 12/04/23 1311    Mozell Arias, MD 12/04/23 1423

## 2023-12-09 ENCOUNTER — Other Ambulatory Visit: Payer: Self-pay | Admitting: Urology

## 2023-12-13 NOTE — Patient Instructions (Addendum)
 SURGICAL WAITING ROOM VISITATION Patients having surgery or a procedure may have no more than 2 support people in the waiting area - these visitors may rotate in the visitor waiting room.   If the patient needs to stay at the hospital during part of their recovery, the visitor guidelines for inpatient rooms apply.  PRE-OP VISITATION  Pre-op nurse will coordinate an appropriate time for 1 support person to accompany the patient in pre-op.  This support person may not rotate.  This visitor will be contacted when the time is appropriate for the visitor to come back in the pre-op area.  Please refer to the [facility] website for the visitor guidelines for Inpatients (after your surgery is over and you are in a regular room).  You are not required to quarantine at this time prior to your surgery. However, you must do this: Hand Hygiene often Do NOT share personal items Notify your provider if you are in close contact with someone who has COVID or you develop fever 100.4 or greater, new onset of sneezing, cough, sore throat, shortness of breath or body aches.  If you test positive for Covid or have been in contact with anyone that has tested positive in the last 10 days please notify you surgeon.    Your procedure is scheduled on:  Thursday  Dec 16, 2023  Report to [facility] Main Entrance: [address] entrance where the [employer] is available.   Report to admitting at: 11:15    AM  Call this number if you have any questions or problems the morning of surgery [phone]  DO NOT EAT OR DRINK ANYTHING AFTER MIDNIGHT THE NIGHT PRIOR TO YOUR SURGERY / PROCEDURE.   FOLLOW  ANY ADDITIONAL PRE OP INSTRUCTIONS YOU RECEIVED FROM YOUR SURGEON'S OFFICE!!!   Oral Hygiene is also important to reduce your risk of infection.        Remember - BRUSH YOUR TEETH THE MORNING OF SURGERY WITH YOUR REGULAR TOOTHPASTE  Do NOT smoke after Midnight the night before surgery.  STOP TAKING all Vitamins,  Herbs and supplements 1 week before your surgery.   Take ONLY these medicines the morning of surgery with A SIP OF WATER: none                  You may not have any metal on your body including hair pins, jewelry, and body piercing  Do not wear make-up, lotions, powders, perfumes or deodorant  Do not wear nail polish including gel and S&S, artificial / acrylic nails, or any other type of covering on natural nails including finger and toenails. If you have artificial nails, gel coating, etc., that needs to be removed by a nail salon, Please have this removed prior to surgery. Not doing so may mean that your surgery could be cancelled or delayed if the Surgeon or anesthesia staff feels like they are unable to monitor you safely.   Do not shave 48 hours prior to surgery to avoid nicks in your skin which may contribute to postoperative infections.   Contacts, Hearing Aids, dentures or bridgework may not be worn into surgery. DENTURES WILL BE REMOVED PRIOR TO SURGERY PLEASE DO NOT APPLY "Poly grip" OR ADHESIVES!!!  Patients discharged on the day of surgery will not be allowed to drive home.  Someone NEEDS to stay with you for the first 24 hours after anesthesia.  Do not bring your home medications to the hospital. The Pharmacy will dispense medications listed on your  medication list to you during your admission in the Hospital.  Special Instructions: Bring a copy of your healthcare power of attorney and living will documents the day of surgery, if you wish to have them scanned into your [facility] Records- EPIC  Please read over the following fact sheets you were given: IF YOU HAVE QUESTIONS ABOUT YOUR PRE-OP INSTRUCTIONS, PLEASE CALL [phone].   [place] - Preparing for Surgery Before surgery, you can play an important role.  Because skin is not sterile, your skin needs to be as free of germs as possible.  You can reduce the number of germs on your skin by washing with CHG  (chlorahexidine gluconate) soap before surgery.  CHG is an antiseptic cleaner which kills germs and bonds with the skin to continue killing germs even after washing. Please DO NOT use if you have an allergy to CHG or antibacterial soaps.  If your skin becomes reddened/irritated stop using the CHG and inform your nurse when you arrive at Short Stay. Do not shave (including legs and underarms) for at least 48 hours prior to the first CHG shower.  You may shave your face/neck.  Please follow these instructions carefully:  1.  Shower with CHG Soap the night before surgery and the  morning of surgery.  2.  If you choose to wash your hair, wash your hair first as usual with your normal  shampoo.  3.  After you shampoo, rinse your hair and body thoroughly to remove the shampoo.                             4.  Use CHG as you would any other liquid soap.  You can apply chg directly to the skin and wash.  Gently with a scrungie or clean washcloth.  5.  Apply the CHG Soap to your body ONLY FROM THE NECK DOWN.   Do not use on face/ open                           Wound or open sores. Avoid contact with eyes, ears mouth and genitals (private parts).                       Wash face,  Genitals (private parts) with your normal soap.             6.  Wash thoroughly, paying special attention to the area where your  surgery  will be performed.  7.  Thoroughly rinse your body with warm water from the neck down.  8.  DO NOT shower/wash with your normal soap after using and rinsing off the CHG Soap.            9.  Pat yourself dry with a clean towel.            10.  Wear clean pajamas.            11.  Place clean sheets on your bed the night of your first shower and do not  sleep with pets.  ON THE DAY OF SURGERY : Do not apply any lotions/deodorants the morning of surgery.  Please wear clean clothes to the hospital/surgery center.    FAILURE TO FOLLOW THESE INSTRUCTIONS MAY RESULT IN THE CANCELLATION OF YOUR  SURGERY  PATIENT SIGNATURE_________________________________  NURSE SIGNATURE__________________________________  ________________________________________________________________________

## 2023-12-13 NOTE — Progress Notes (Addendum)
 LEFT ARM  RESTRICTION  COVID Vaccine received:  []  No [x]  Yes Date of any COVID positive Test in last 90 days:  PCP - none Cardiologist - none  Chest x-ray -  EKG - no hx to warrant Stress Test -  ECHO -  Cardiac Cath -   Pacemaker / ICD device [x]  No []  Yes   Spinal Cord Stimulator:[x]  No []  Yes       History of Sleep Apnea? [x]  No []  Yes   CPAP used?- [x]  No []  Yes    Does the patient monitor blood sugar?   [x]  N/A   []  No []  Yes  Patient has: [x]  NO Hx DM   []  Pre-DM   []  DM1  []   DM2  Blood Thinner / Instructions:  none Aspirin Instructions:  none  ERAS Protocol Ordered: [x]  No  []  Yes Patient is to be NPO after: MN  Dental hx: []  Dentures:  [x]  N/A      []  Bridge or Partial:                   []  Loose or Damaged teeth:   Comments:Seen at [facility] on 12-04-23 and had blood work done (CBC diff, CMP)   Activity level: Patient is able to climb a flight of stairs without difficulty; [x]  No CP  but would have _SOB  Patient can perform ADLs without assistance.   Anesthesia review: No pertinent hx, s/p bilateral mastectomies for cancer.   Patient denies shortness of breath, fever, cough and chest pain at PAT appointment.  Patient verbalized understanding and agreement to the Pre-Surgical Instructions that were given to them at this PAT appointment. Patient was also educated of the need to review these PAT instructions again prior to her surgery.I reviewed the appropriate phone numbers to call if they have any and questions or concerns.

## 2023-12-15 ENCOUNTER — Encounter (HOSPITAL_COMMUNITY): Payer: Self-pay | Admitting: *Deleted

## 2023-12-15 ENCOUNTER — Encounter (HOSPITAL_COMMUNITY)
Admission: RE | Admit: 2023-12-15 | Discharge: 2023-12-15 | Disposition: A | Discharge status: Home / Self Care | Source: Ambulatory Visit | Attending: Urology | Admitting: Urology

## 2023-12-15 ENCOUNTER — Other Ambulatory Visit: Payer: Self-pay

## 2023-12-15 DIAGNOSIS — Z01818 Encounter for other preprocedural examination: Secondary | ICD-10-CM | POA: Insufficient documentation

## 2023-12-15 HISTORY — DX: Unspecified osteoarthritis, unspecified site: M19.90

## 2023-12-15 HISTORY — DX: Personal history of urinary calculi: Z87.442

## 2023-12-15 HISTORY — DX: Chronic kidney disease, unspecified: N18.9

## 2023-12-15 HISTORY — DX: Anxiety disorder, unspecified: F41.9

## 2023-12-16 ENCOUNTER — Ambulatory Visit (HOSPITAL_COMMUNITY)
Admission: RE | Admit: 2023-12-16 | Discharge: 2023-12-16 | Disposition: A | Discharge status: Home / Self Care | Attending: Urology | Admitting: Urology

## 2023-12-16 ENCOUNTER — Encounter (HOSPITAL_COMMUNITY)
Admission: RE | Disposition: A | Discharge status: Home / Self Care | Payer: Self-pay | Source: Home / Self Care | Attending: Urology

## 2023-12-16 ENCOUNTER — Ambulatory Visit (HOSPITAL_COMMUNITY): Admitting: Anesthesiology

## 2023-12-16 ENCOUNTER — Encounter (HOSPITAL_COMMUNITY): Payer: Self-pay | Admitting: Urology

## 2023-12-16 ENCOUNTER — Ambulatory Visit (HOSPITAL_COMMUNITY)

## 2023-12-16 ENCOUNTER — Ambulatory Visit (HOSPITAL_BASED_OUTPATIENT_CLINIC_OR_DEPARTMENT_OTHER): Admitting: Anesthesiology

## 2023-12-16 DIAGNOSIS — Z6834 Body mass index (BMI) 34.0-34.9, adult: Secondary | ICD-10-CM | POA: Diagnosis not present

## 2023-12-16 DIAGNOSIS — F419 Anxiety disorder, unspecified: Secondary | ICD-10-CM | POA: Insufficient documentation

## 2023-12-16 DIAGNOSIS — N1330 Unspecified hydronephrosis: Secondary | ICD-10-CM

## 2023-12-16 DIAGNOSIS — E669 Obesity, unspecified: Secondary | ICD-10-CM | POA: Insufficient documentation

## 2023-12-16 DIAGNOSIS — N189 Chronic kidney disease, unspecified: Secondary | ICD-10-CM | POA: Diagnosis not present

## 2023-12-16 DIAGNOSIS — Z87442 Personal history of urinary calculi: Secondary | ICD-10-CM | POA: Diagnosis not present

## 2023-12-16 DIAGNOSIS — N132 Hydronephrosis with renal and ureteral calculous obstruction: Secondary | ICD-10-CM | POA: Diagnosis not present

## 2023-12-16 DIAGNOSIS — M1990 Unspecified osteoarthritis, unspecified site: Secondary | ICD-10-CM | POA: Insufficient documentation

## 2023-12-16 DIAGNOSIS — N201 Calculus of ureter: Secondary | ICD-10-CM

## 2023-12-16 HISTORY — PX: CYSTOSCOPY/URETEROSCOPY/HOLMIUM LASER/STENT PLACEMENT: SHX6546

## 2023-12-16 SURGERY — CYSTOSCOPY/URETEROSCOPY/HOLMIUM LASER/STENT PLACEMENT
Anesthesia: General | Laterality: Left

## 2023-12-16 MED ORDER — PROPOFOL 10 MG/ML IV BOLUS
INTRAVENOUS | Status: DC | PRN
  Administered 2023-12-16: 200 mg via INTRAVENOUS

## 2023-12-16 MED ORDER — FENTANYL CITRATE PF 50 MCG/ML IJ SOSY
25.0000 ug | PREFILLED_SYRINGE | INTRAMUSCULAR | Status: DC | PRN
Start: 2023-12-16 — End: 2023-12-16

## 2023-12-16 MED ORDER — DEXAMETHASONE SODIUM PHOSPHATE 10 MG/ML IJ SOLN
INTRAMUSCULAR | Status: DC | PRN
  Administered 2023-12-16: 5 mg via INTRAVENOUS

## 2023-12-16 MED ORDER — LIDOCAINE HCL (PF) 2 % IJ SOLN
INTRAMUSCULAR | Status: DC | PRN
  Administered 2023-12-16: 100 mg via INTRADERMAL

## 2023-12-16 MED ORDER — ONDANSETRON HCL 4 MG/2ML IJ SOLN
INTRAMUSCULAR | Status: DC | PRN
  Administered 2023-12-16: 4 mg via INTRAVENOUS

## 2023-12-16 MED ORDER — PHENYLEPHRINE 80 MCG/ML (10ML) SYRINGE FOR IV PUSH (FOR BLOOD PRESSURE SUPPORT)
PREFILLED_SYRINGE | INTRAVENOUS | Status: DC | PRN
  Administered 2023-12-16: 80 ug via INTRAVENOUS

## 2023-12-16 MED ORDER — DEXAMETHASONE SODIUM PHOSPHATE 10 MG/ML IJ SOLN
INTRAMUSCULAR | Status: AC
  Filled 2023-12-16: qty 1

## 2023-12-16 MED ORDER — ORAL CARE MOUTH RINSE: 15.0000 mL | Freq: Once | OROMUCOSAL | Status: AC

## 2023-12-16 MED ORDER — GENTAMICIN SULFATE 40 MG/ML IJ SOLN
5.0000 mg/kg | INTRAVENOUS | Status: AC
  Administered 2023-12-16: 308.4 mg via INTRAVENOUS
  Filled 2023-12-16: qty 7.75

## 2023-12-16 MED ORDER — SODIUM CHLORIDE 0.9 % IR SOLN
Status: DC | PRN
  Administered 2023-12-16: 3000 mL

## 2023-12-16 MED ORDER — MIDAZOLAM HCL 5 MG/5ML IJ SOLN
INTRAMUSCULAR | Status: DC | PRN
  Administered 2023-12-16: 2 mg via INTRAVENOUS

## 2023-12-16 MED ORDER — OXYCODONE HCL 5 MG PO TABS: 5.0000 mg | ORAL_TABLET | Freq: Once | ORAL | Status: DC | PRN

## 2023-12-16 MED ORDER — MIDAZOLAM HCL 2 MG/2ML IJ SOLN
INTRAMUSCULAR | Status: AC
  Filled 2023-12-16: qty 2

## 2023-12-16 MED ORDER — ACETAMINOPHEN 500 MG PO TABS
1000.0000 mg | ORAL_TABLET | Freq: Once | ORAL | Status: AC
  Administered 2023-12-16: 1000 mg via ORAL
  Filled 2023-12-16: qty 2

## 2023-12-16 MED ORDER — FENTANYL CITRATE (PF) 100 MCG/2ML IJ SOLN
INTRAMUSCULAR | Status: DC | PRN
Start: 2023-12-16 — End: 2023-12-16
  Administered 2023-12-16 (×2): 50 ug via INTRAVENOUS

## 2023-12-16 MED ORDER — AMISULPRIDE (ANTIEMETIC) 5 MG/2ML IV SOLN: 10.0000 mg | Freq: Once | INTRAVENOUS | Status: DC | PRN

## 2023-12-16 MED ORDER — ONDANSETRON HCL 4 MG/2ML IJ SOLN
INTRAMUSCULAR | Status: AC
  Filled 2023-12-16: qty 2

## 2023-12-16 MED ORDER — CHLORHEXIDINE GLUCONATE 0.12 % MT SOLN
15.0000 mL | Freq: Once | OROMUCOSAL | Status: AC
  Administered 2023-12-16: 15 mL via OROMUCOSAL

## 2023-12-16 MED ORDER — IOHEXOL 300 MG/ML  SOLN
INTRAMUSCULAR | Status: DC | PRN
  Administered 2023-12-16: 10 mL

## 2023-12-16 MED ORDER — LIDOCAINE HCL (PF) 2 % IJ SOLN
INTRAMUSCULAR | Status: AC
Start: 2023-12-16 — End: ?
  Filled 2023-12-16: qty 5

## 2023-12-16 MED ORDER — LACTATED RINGERS IV SOLN: INTRAVENOUS | Status: DC

## 2023-12-16 MED ORDER — OXYCODONE HCL 5 MG/5ML PO SOLN: 5.0000 mg | Freq: Once | ORAL | Status: DC | PRN

## 2023-12-16 MED ORDER — FENTANYL CITRATE (PF) 100 MCG/2ML IJ SOLN
INTRAMUSCULAR | Status: AC
  Filled 2023-12-16: qty 2

## 2023-12-16 SURGICAL SUPPLY — 18 items
BAG URO CATCHER STRL LF (MISCELLANEOUS) ×1 IMPLANT
BASKET LASER NITINOL 1.9FR (BASKET) IMPLANT
CATH URETL OPEN END 6FR 70 (CATHETERS) ×1 IMPLANT
CLOTH BEACON ORANGE TIMEOUT ST (SAFETY) ×1 IMPLANT
GLOVE SURG LX STRL 7.5 STRW (GLOVE) ×1 IMPLANT
GOWN STRL REUS W/ TWL XL LVL3 (GOWN DISPOSABLE) ×1 IMPLANT
GUIDEWIRE ANG ZIPWIRE 038X150 (WIRE) ×1 IMPLANT
GUIDEWIRE STR DUAL SENSOR (WIRE) ×1 IMPLANT
KIT TURNOVER KIT A (KITS) IMPLANT
MANIFOLD NEPTUNE II (INSTRUMENTS) ×1 IMPLANT
PACK CYSTO (CUSTOM PROCEDURE TRAY) ×1 IMPLANT
SHEATH NAVIGATOR HD 11/13X28 (SHEATH) IMPLANT
SHEATH NAVIGATOR HD 11/13X36 (SHEATH) IMPLANT
STENT URETL 4.8FRX22 LL FIRM (STENTS) IMPLANT
TRACTIP FLEXIVA PULS ID 200XHI (Laser) IMPLANT
TUBE PU 8FR 16IN ENFIT (TUBING) ×1 IMPLANT
TUBING CONNECTING 10 (TUBING) ×1 IMPLANT
TUBING UROLOGY SET (TUBING) ×1 IMPLANT

## 2023-12-16 NOTE — Anesthesia Postprocedure Evaluation (Signed)
 Anesthesia Post Note  Patient: Samantha Mckay   Procedure(s) Performed: CYSTOSCOPY/URETEROSCOPY/STENT PLACEMENT (Left)     Patient location during evaluation: PACU Anesthesia Type: General Level of consciousness: awake and alert Pain management: pain level controlled Vital Signs Assessment: post-procedure vital signs reviewed and stable Respiratory status: spontaneous breathing, nonlabored ventilation, respiratory function stable and patient connected to nasal cannula oxygen Cardiovascular status: blood pressure returned to baseline and stable Postop Assessment: no apparent nausea or vomiting Anesthetic complications: no  No notable events documented.  Last Vitals:  Vitals:   12/16/23 1430 12/16/23 1445  BP: 130/89 108/80  Pulse: 68 61  Resp: 12   Temp: (!) 36.3 C   SpO2: 98% 99%    Last Pain:  Vitals:   12/16/23 1445  TempSrc:   PainSc: 0-No pain                 Amandeep Nesmith L Tazia Illescas

## 2023-12-16 NOTE — H&P (Signed)
 Samantha Mckay  is an 58 y.o. female.    Chief Complaint: Pre-op LEFT Ureteroscopic Stone Manipulation  HPI:   1 - LEFT Distal Ureteral Stone - 7mm left distal (just above intramural ureter) fusiform stone with mild hydro on ER CT 11/2023. Stone is solitary. Numerous phleboliths nearby. UA without infectious parameters. Cr 0.8. Contralateral kidney unremarkeble. Has throast swellign with tamsulosin .   PMH sig for obesity, large hepatic cysts, bilateral mastectomy and BSO. Works from [employer] for [employer]. Daughter in [employer] school and daughter in Engineer, technical sales.   Today " Kai " is seen to proceed with LEFT ureteroscopic stone manipulaiton. NO interval fevers or stone passage.    Past Medical History:  Diagnosis Date   Anxiety    Arthritis    Breast cancer (HCC) 09/22/2011    right and left masectomy wirth expanders   Breast cancer (HCC) 09/21/11 BX   Left breast  masectomy-invasive ductal ca   Breast cancer (HCC)    ER/PR positive   Cancer (HCC)    breast   Chronic kidney disease    History of kidney stones    Liver mass     numerous simple cysts throughout hepatic parenbhyma,one 10.7cm    Past Surgical History:  Procedure Laterality Date   AXILLARY LYMPH NODE DISSECTION  09/21/2011   Procedure: AXILLARY LYMPH NODE DISSECTION;  Surgeon: Kari Otto. Eli Grizzle, MD;  Location: [facility] OR;  Service: General;  Laterality: Left;   BREAST IMPLANT EXCHANGE Bilateral 11/17/2012   Procedure: BILATERAL REMOVAL OF TISSUE EXPANDER/PLACEMENT OF IMPLANTS;  Surgeon: Marilou Showman, DO;  Location: [facility];  Service: Plastics;  Laterality: Bilateral;   INTRAUTERINE DEVICE INSERTION     IUD REMOVAL  02/15/2012   Procedure: INTRAUTERINE DEVICE (IUD) REMOVAL;  Surgeon: Ashby Lawman, MD;  Location: [facility];  Service: Gynecology;  Laterality: N/A;   LAPAROSCOPY  02/15/2012   Procedure: LAPAROSCOPY OPERATIVE;  Surgeon: Ashby Lawman, MD;  Location: [facility];  Service: Gynecology;  Laterality: N/A;    MASTECTOMY W/ SENTINEL NODE BIOPSY  09/21/2011   Procedure: MASTECTOMY WITH SENTINEL LYMPH NODE BIOPSY;  Surgeon: Kari Otto. Eli Grizzle, MD;  Location: [facility] OR;  Service: General;  Laterality: Bilateral;  Bilateral total mastectomies with left axillary sentinel lymph node biopsy.   SALPINGOOPHORECTOMY  02/15/2012   Procedure: SALPINGO OOPHERECTOMY;  Surgeon: Ashby Lawman, MD;  Location: [facility];  Service: Gynecology;  Laterality: Bilateral;   TISSUE EXPANDER PLACEMENT  09/21/2011   Procedure: TISSUE EXPANDER;  Surgeon: Marilou Showman, DO;  Location: [facility];  Service: Plastics;  Laterality: Bilateral;  Bilateral breast reconstruction with placement of bilateral tissue expanders.    Family History  Problem Relation Age of Onset   Cancer Mother        stomach   Colon cancer Mother    Anesthesia problems Neg Hx    Hypotension Neg Hx    Malignant hyperthermia Neg Hx    Pseudochol deficiency Neg Hx    Colon polyps Neg Hx    Esophageal cancer Neg Hx    Rectal cancer Neg Hx    Stomach cancer Neg Hx    Social History:  reports that she has never smoked. She has never used smokeless tobacco. She reports current alcohol use of about 2.0 standard drinks of alcohol per week. She reports that she does not use drugs.  Allergies:  Allergies  Allergen Reactions   Tamsulosin  Hcl Swelling    Swelling of glands around throat    Facility-Administered Medications Prior to Admission  Medication Dose Route Frequency Provider Last Rate Last Admin   0.9 %  sodium chloride  infusion  500 mL Intravenous Once Beavers, Kimberly, MD       Medications Prior to Admission  Medication Sig Dispense Refill   cholecalciferol (VITAMIN D ) 1000 UNITS tablet Take 1,000 Units by mouth 2 (two) times daily.     Multiple Vitamin (MULTIVITAMIN) tablet Take 1 tablet by mouth daily.     EPINEPHrine  0.3 mg/0.3 mL IJ SOAJ injection Inject 0.3 mg into the muscle as needed for anaphylaxis. 1 each 0   ondansetron  (ZOFRAN ) 4 MG tablet Take 1  tablet (4 mg total) by mouth every 6 (six) hours. (Patient taking differently: Take 4 mg by mouth every 6 (six) hours as needed for vomiting or nausea.) 12 tablet 0   oxyCODONE  (ROXICODONE ) 5 MG immediate release tablet Take 1 tablet (5 mg total) by mouth every 4 (four) hours as needed for severe pain (pain score 7-10). 10 tablet 0   promethazine  (PHENERGAN ) 25 MG tablet Take 1 tablet (25 mg total) by mouth every 6 (six) hours as needed for nausea or vomiting. 30 tablet 0    No results found for this or any previous visit (from the past 48 hours). No results found.  Review of Systems  Constitutional:  Negative for chills and fever.  Genitourinary:  Positive for flank pain.  All other systems reviewed and are negative.   There were no vitals taken for this visit. Physical Exam Vitals reviewed.  HENT:     Head: Normocephalic.  Eyes:     Pupils: Pupils are equal, round, and reactive to light.  Cardiovascular:     Rate and Rhythm: Normal rate.  Pulmonary:     Effort: Pulmonary effort is normal.  Genitourinary:    Comments: Very mild left CVAT at present Musculoskeletal:        General: Normal range of motion.     Cervical back: Normal range of motion.  Skin:    General: Skin is warm.  Neurological:     General: No focal deficit present.     Mental Status: She is alert.  Psychiatric:        Mood and Affect: Mood normal.      Assessment/Plan  Proceed as planned with LEFT ureteroscopic stone manipulation. Risks, benefits, alternatives, expected peri-op course discussed perviously and reiterated today.   Melody Spurling., MD 12/16/2023, 11:46 AM

## 2023-12-16 NOTE — Discharge Instructions (Addendum)
 1 - You may have urinary urgency (bladder spasms) and bloody urine on / off with stent in place. This is normal.  2 - Remove tethered stent around noon on Friday 5/23 by pulling on string, then white plastic tubing, and discarding. Office is open Friday if any issues arise.   3 - Call MD or go to ER for fever >102, severe pain / nausea / vomiting not relieved by medications, or acute change in medical status

## 2023-12-16 NOTE — Brief Op Note (Signed)
 12/16/2023  1:40 PM  PATIENT:  Samantha Mckay   58 y.o. female  PRE-OPERATIVE DIAGNOSIS:  LEFT DISTAL URETERAL STONE  POST-OPERATIVE DIAGNOSIS:  LEFT DISTAL URETERAL STONE  PROCEDURE:  Procedure(s): CYSTOSCOPY/URETEROSCOPY/STENT PLACEMENT (Left)  SURGEON:  Surgeons and Role:    * Manny, Harvey Linen., MD - Primary  PHYSICIAN ASSISTANT:   ASSISTANTS: none   ANESTHESIA:   general  EBL:  minimal   BLOOD ADMINISTERED:none  DRAINS: none   LOCAL MEDICATIONS USED:  NONE  SPECIMEN:  Source of Specimen:  left ureteral stones   DISPOSITION OF SPECIMEN:  [facility] for compositional analysis  COUNTS:  YES  TOURNIQUET:  * No tourniquets in log *  DICTATION: .Other Dictation: Dictation Number [id number]  PLAN OF CARE: Discharge to home after PACU  PATIENT DISPOSITION:  PACU - hemodynamically stable.   Delay start of Pharmacological VTE agent (>24hrs) due to surgical blood loss or risk of bleeding: yes

## 2023-12-16 NOTE — Anesthesia Procedure Notes (Signed)
 Procedure Name: LMA Insertion Date/Time: 12/16/2023 1:15 PM  Performed by: Micky Albee, CRNAPre-anesthesia Checklist: Patient identified, Emergency Drugs available, Patient being monitored and Suction available Patient Re-evaluated:Patient Re-evaluated prior to induction Oxygen Delivery Method: Circle system utilized Preoxygenation: Pre-oxygenation with 100% oxygen Induction Type: IV induction Ventilation: Mask ventilation without difficulty LMA Size: 4.0

## 2023-12-16 NOTE — Transfer of Care (Signed)
 Immediate Anesthesia Transfer of Care Note  Patient: Samantha Mckay   Procedure(s) Performed: CYSTOSCOPY/URETEROSCOPY/STENT PLACEMENT (Left)  Patient Location: PACU  Anesthesia Type:General  Level of Consciousness: sedated  Airway & Oxygen Therapy: Patient Spontanous Breathing and Patient connected to face mask oxygen  Post-op Assessment: Report given to RN and Post -op Vital signs reviewed and stable  Post vital signs: Reviewed and stable  Last Vitals:  Vitals Value Taken Time  BP    Temp    Pulse 98 12/16/23 1347  Resp    SpO2 100 % 12/16/23 1347  Vitals shown include unfiled device data.  Last Pain:  Vitals:   12/16/23 1200  TempSrc:   PainSc: 0-No pain         Complications: No notable events documented.

## 2023-12-16 NOTE — Anesthesia Preprocedure Evaluation (Addendum)
 Anesthesia Evaluation  Patient identified by MRN, date of birth, ID band Patient awake    Reviewed: Allergy & Precautions, NPO status , Patient's Chart, lab work & pertinent test results  Airway Mallampati: II  TM Distance: >3 FB Neck ROM: Full    Dental no notable dental hx. (+) Teeth Intact, Dental Advisory Given   Pulmonary neg pulmonary ROS   Pulmonary exam normal breath sounds clear to auscultation       Cardiovascular negative cardio ROS Normal cardiovascular exam Rhythm:Regular Rate:Normal     Neuro/Psych  PSYCHIATRIC DISORDERS Anxiety     negative neurological ROS     GI/Hepatic negative GI ROS, Neg liver ROS,,,  Endo/Other  negative endocrine ROS    Renal/GU Renal InsufficiencyRenal disease  negative genitourinary   Musculoskeletal  (+) Arthritis ,    Abdominal   Peds  Hematology negative hematology ROS (+)   Anesthesia Other Findings   Reproductive/Obstetrics                             Anesthesia Physical Anesthesia Plan  ASA: 2  Anesthesia Plan: General   Post-op Pain Management: Tylenol  PO (pre-op)*   Induction: Intravenous  PONV Risk Score and Plan: 3 and Ondansetron , Dexamethasone  and Midazolam   Airway Management Planned: LMA  Additional Equipment:   Intra-op Plan:   Post-operative Plan: Extubation in OR  Informed Consent: I have reviewed the patients History and Physical, chart, labs and discussed the procedure including the risks, benefits and alternatives for the proposed anesthesia with the patient or authorized representative who has indicated his/her understanding and acceptance.     Dental advisory given  Plan Discussed with: CRNA  Anesthesia Plan Comments:        Anesthesia Quick Evaluation

## 2023-12-16 NOTE — Op Note (Unsigned)
 NAME: Samantha Mckay , [facility] MEDICAL RECORD NO: [id number] ACCOUNT NO: [id number] DATE OF BIRTH: Apr 14, 1966 FACILITY: [facility] LOCATION: [facility] PHYSICIAN: Osborn Blaze, MD  Operative Report   DATE OF PROCEDURE: 12/16/2023  PREOPERATIVE DIAGNOSIS:  Left distal ureteral stone.  PROCEDURE PERFORMED: 1.  Cystoscopy with left retrograde pyelograms with interpretation. 2.  Left ureteroscopy with basketing of stone. 3.  Insertion of left ureteral stent placement.  ESTIMATED BLOOD LOSS:  Nil.  COMPLICATIONS:  None.  SPECIMENS:  Left ureteral stone fragments for composition analysis.  FINDINGS: 1.  Mild hydronephrosis to distal ureter consistent with known stone. 2.  Mild to moderate mucosal edema in the very distal ureter following passage of stone. 3.  Successful placement of left ureteral stent, proximal end in renal pelvis, distal end in urinary bladder with tether.  INDICATIONS:  The patient is a pleasant 58 year old lady with a history of left distal ureteral stone recently approximately 7 mm by axial imaging.  Options were discussed for management including medical therapy versus shock wave lithotripsy versus  ureteroscopy.  Again, the relatively distal location of the stone as well as numerous phleboliths in the area, which would make shock wave lithotripsy difficult to target.  We agreed upon left ureteroscopy with a goal of stone free.  She presents for  this today.  Informed consent was obtained and placed in the medical record.  DESCRIPTION OF PROCEDURE:  The patient being identified and verified, the procedure being left ureteroscopic stone manipulation was confirmed.  Procedure timeout was performed.  Intravenous antibiotics administered.  General LMA anesthesia introduced.   The patient was placed into a low lithotomy position.  A sterile field was created, prepped and draped the patient's vagina, introitus and proximal thighs using iodine.  Cystourethroscopy was performed using  21-French rigid cystoscope with offset lens.   Inspection of urinary bladder revealed no diverticula, calcifications, or papillary lesions.  The left ureteral orifice was cannulated with an 6-French open-ended catheter, and a left retrograde pyelogram was obtained.  Left retrograde pyelogram demonstrated single left ureter, single system left kidney.  There was a mobile filling defect in the distal ureter consistent with known stone.  There was mild hydronephrosis above this.  A 0.038 ZIPwire was advanced to the  level of upper pole set aside as a safety wire.  An 8-French feeding tube was placed in the urinary bladder for pressure release.  Semirigid ureteroscopy was performed of the distal left ureter alongside a separate sensor working wire.  As anticipated,  there was a single dominant fusiform stone noted just above intramural ureter with some mild to moderate mucosal edema around this.  Given the very fusiform nature, it was felt that this was amenable to simple basketing.  This was grasped on its long  axis and removed and set aside for composition analysis.  Repeat ureteroscopy of the distal orifice left ureter revealed only two small additional fragments that appeared to have dislodged from the dominant fragment.  These were also amenable to simple  basketing.  No additional calcifications were noted.  As there was some mucosal edema at the site of stone impaction, it was felt that brief interval stenting with a tethered stent would be prudent and a new 4.8 x 22 stent was placed using fluoroscopic  guidance.  Good proximal and distal planes were noted.  The tether was left in place, trimmed to length, tucked per vagina, and the procedure was terminated.  The patient tolerated the procedure well and there were no immediate periprocedural  complications.  The patient was taken to the postanesthesia care unit in stable condition with plans for discharge home.   PUS D: 12/16/2023 1:44:48 pm T:  12/16/2023 4:42:00 pm  JOB: [id number]/ [id number]

## 2023-12-17 ENCOUNTER — Encounter (HOSPITAL_COMMUNITY): Payer: Self-pay | Admitting: Urology

## 2024-08-01 ENCOUNTER — Emergency Department (HOSPITAL_BASED_OUTPATIENT_CLINIC_OR_DEPARTMENT_OTHER)
Admission: EM | Admit: 2024-08-01 | Discharge: 2024-08-01 | Disposition: A | Discharge status: Home / Self Care | Attending: Emergency Medicine | Admitting: Emergency Medicine

## 2024-08-01 ENCOUNTER — Other Ambulatory Visit: Payer: Self-pay

## 2024-08-01 ENCOUNTER — Encounter (HOSPITAL_BASED_OUTPATIENT_CLINIC_OR_DEPARTMENT_OTHER): Payer: Self-pay

## 2024-08-01 DIAGNOSIS — J069 Acute upper respiratory infection, unspecified: Secondary | ICD-10-CM | POA: Diagnosis not present

## 2024-08-01 DIAGNOSIS — N189 Chronic kidney disease, unspecified: Secondary | ICD-10-CM | POA: Insufficient documentation

## 2024-08-01 DIAGNOSIS — Z87442 Personal history of urinary calculi: Secondary | ICD-10-CM | POA: Insufficient documentation

## 2024-08-01 DIAGNOSIS — Z853 Personal history of malignant neoplasm of breast: Secondary | ICD-10-CM | POA: Diagnosis not present

## 2024-08-01 DIAGNOSIS — R509 Fever, unspecified: Secondary | ICD-10-CM | POA: Diagnosis present

## 2024-08-01 LAB — GROUP A STREP BY PCR: Group A Strep by PCR: NOT DETECTED

## 2024-08-01 LAB — RESP PANEL BY RT-PCR (RSV, FLU A&B, COVID)  RVPGX2
Influenza A by PCR: NEGATIVE
Influenza B by PCR: NEGATIVE
Resp Syncytial Virus by PCR: NEGATIVE
SARS Coronavirus 2 by RT PCR: NEGATIVE

## 2024-08-01 NOTE — ED Notes (Signed)
 ..  The patient is A&OX4, ambulatory at d/c with independent steady gait, NAD. Pt verbalized understanding of d/c instructions and follow up care.

## 2024-08-01 NOTE — ED Triage Notes (Signed)
 Pt reports nasal congestion, sore throat for a couple of days, body aches and headache. Symptoms started 2 days ago.

## 2024-08-01 NOTE — Discharge Instructions (Addendum)
 Your respiratory panel and stress test were negative.  You likely have a viral upper respiratory infection.  You may use Tylenol  1000 mg and/or Motrin 600 mg every 4-6 hours up to 3 times a day for fever or body aches.  Please keep in mind that this dosing is not meant to be continued long-term and that many over-the-counter cough and flu medications contain acetaminophen  or ibuprofen. You can expect your current symptoms to linger over the next week or two but please return to the emergency room if you experience any new or worsening symptoms including persistent fevers, worsening productive cough and persistent vomiting. Please follow-up with your primary care provider regarding your ER visit.

## 2024-08-01 NOTE — ED Provider Notes (Signed)
 "  EMERGENCY DEPARTMENT AT [facility] Provider Note   CSN: [id number] Arrival date & time: 08/01/24  1454     Patient presents with: Nasal Congestion   Samantha Mckay  is a 59 y.o. female presents with sore throat, nasal congestion, subjective fevers and chills and bodyaches over the past couple days.  Reports recent contact with influenza.  Has had some diarrhea.  No abdominal pain or vomiting.   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 Belinda, MD;  Location: [facility];  Service: General;  Laterality: Left;   BREAST IMPLANT EXCHANGE Bilateral 11/17/2012   Procedure: BILATERAL REMOVAL OF TISSUE EXPANDER/PLACEMENT OF IMPLANTS;  Surgeon: Estefana Reichert, DO;  Location: [facility];  Service: Plastics;  Laterality: Bilateral;   CYSTOSCOPY/URETEROSCOPY/HOLMIUM LASER/STENT PLACEMENT Left 12/16/2023   Procedure: CYSTOSCOPY/URETEROSCOPY/STENT PLACEMENT;  Surgeon: Alvaro Ricardo KATHEE Mickey., MD;  Location: [facility];  Service: Urology;  Laterality: Left;   INTRAUTERINE DEVICE INSERTION     IUD REMOVAL  02/15/2012   Procedure: INTRAUTERINE DEVICE (IUD) REMOVAL;  Surgeon: Truman Corona, MD;  Location: [facility];  Service: Gynecology;  Laterality: N/A;   LAPAROSCOPY  02/15/2012   Procedure: LAPAROSCOPY OPERATIVE;  Surgeon: Truman Corona, MD;  Location: [facility];  Service: Gynecology;  Laterality: N/A;   MASTECTOMY W/ SENTINEL NODE  BIOPSY  09/21/2011   Procedure: MASTECTOMY WITH SENTINEL LYMPH NODE BIOPSY;  Surgeon: Donnice POUR. Belinda, MD;  Location: [facility];  Service: General;  Laterality: Bilateral;  Bilateral total mastectomies with left axillary sentinel lymph node biopsy.   SALPINGOOPHORECTOMY  02/15/2012   Procedure: SALPINGO OOPHERECTOMY;  Surgeon: Truman Corona, MD;  Location: [facility];  Service: Gynecology;  Laterality: Bilateral;   TISSUE EXPANDER PLACEMENT  09/21/2011   Procedure: TISSUE EXPANDER;  Surgeon: Estefana Reichert, DO;  Location: [facility];  Service: Plastics;  Laterality: Bilateral;  Bilateral breast reconstruction with placement of bilateral tissue expanders.       Prior to Admission medications  Medication Sig Start Date End Date Taking? Authorizing Provider  cholecalciferol (VITAMIN D ) 1000 UNITS tablet Take 1,000 Units by mouth 2 (two) times daily.    [provider]  EPINEPHrine  0.3 mg/0.3 mL IJ SOAJ injection Inject 0.3 mg into the muscle as needed for anaphylaxis. 11/15/23   Silver Wonda LABOR, PA  Multiple Vitamin (MULTIVITAMIN) tablet Take 1 tablet by mouth daily.    [provider]  ondansetron  (ZOFRAN ) 4 MG tablet Take 1 tablet (4 mg total) by mouth every 6 (six) hours. Patient taking differently: Take 4 mg by mouth every 6 (six) hours as needed for vomiting or nausea. 11/14/23   Victor Lynwood DASEN, PA-C  oxyCODONE  (ROXICODONE ) 5 MG immediate release tablet Take 1 tablet (5 mg total) by mouth every 4 (four) hours as needed for severe pain (pain score 7-10). 12/04/23   Nivia Colon, PA-C  promethazine  (PHENERGAN ) 25 MG tablet Take 1 tablet (25 mg total) by mouth every 6 (six) hours as needed for nausea or vomiting. 11/15/23   Silver Wonda LABOR, PA    Allergies: Tamsulosin  hcl    Review of Systems  HENT:  Positive for sore throat.     Updated Vital Signs BP (!) 130/91 (BP Location: Right Arm)   Pulse 89   Temp 99.1 F (37.3 C) (Oral)   Resp 16   Ht 5' 1 (1.549 m)   Wt 79.8 kg    SpO2 98%   BMI 33.25 kg/m   Physical Exam Vitals and nursing note reviewed.  Constitutional:      General: She is not in acute distress.    Appearance: She is well-developed.  HENT:     Head: Normocephalic and atraumatic.     Mouth/Throat:     Pharynx: No oropharyngeal exudate or posterior oropharyngeal erythema.     Comments: Uvula is midline, no trismus or pooling of secretions.  No abnormal phonation. Eyes:     Conjunctiva/sclera: Conjunctivae normal.  Cardiovascular:     Rate and Rhythm: Normal rate and regular rhythm.     Heart sounds: No murmur heard. Pulmonary:     Effort: Pulmonary effort is normal. No respiratory distress.     Breath sounds: Normal breath sounds.  Abdominal:     Palpations: Abdomen is soft.     Tenderness: There is no abdominal tenderness.  Musculoskeletal:        General: No swelling.     Cervical back: Neck supple.  Skin:    General: Skin is warm and dry.     Capillary Refill: Capillary refill takes less than 2 seconds.  Neurological:     Mental Status: She is alert.  Psychiatric:        Mood and Affect: Mood normal.     (all labs ordered are listed, but only abnormal results are displayed) Labs Reviewed  GROUP A STREP BY PCR  RESP PANEL BY RT-PCR (RSV, FLU A&B, COVID)  [id number]    EKG: None  Radiology: No results found.   Procedures   Medications Ordered in the ED - No data to display  Clinical Course as of 08/01/24 1638  Tue Aug 01, 2024  1550 Patient evaluated for URI symptoms x 2 days.  Upon arrival she is hemodynamically stable and nontoxic-appearing.  Her exam is entirely benign. [JT]  1616 Group A Strep by PCR Negative [JT]  1637 Resp panel by RT-PCR (RSV, Flu A&B, Covid) Anterior Nasal Swab Negative [JT]  1638 Workup overall reassuring.  Likely etiology viral URI.  Patient educated on supportive care.  Strict return precautions provided.  Patient discharged home. [JT]    Clinical Course User Index [JT] Donnajean Lynwood DEL, PA-C                                 Medical Decision Making  This patient presents to the ED with chief complaint(s) of URI .  The complaint involves an extensive differential diagnosis and also carries with it a high risk of complications and morbidity.   Pertinent past medical history as listed in HPI  The differential diagnosis includes  Do not suspect pneumonia as patient is afebrile with clear lung sounds and no significant cough.  Exam and history not consistent with Ludwig's angina or deep space infection. Additional history obtained: Records reviewed Care Everywhere/External Records  Disposition:   Patient will be discharged home. The patient has been appropriately medically screened and/or stabilized in the ED. I have low suspicion for any other emergent medical condition which would require further screening, evaluation or treatment in the ED or require inpatient management. At time of discharge the patient is hemodynamically stable and in no acute distress. I have discussed work-up results and diagnosis with patient and answered all questions. Patient is agreeable with discharge plan. We discussed strict return precautions for returning to the emergency department and they verbalized understanding.     Social Determinants of Health:   none  This note was dictated with voice recognition software.  Despite best efforts at proofreading, errors may have occurred which can change the documentation meaning.       Final diagnoses:  Upper respiratory tract infection, unspecified type    ED Discharge Orders     None          Donnajean Lynwood VEAR DEVONNA 08/01/24 1638    Patsey Lot, MD 08/01/24 2308  "

## 2024-08-04 ENCOUNTER — Telehealth: Payer: Self-pay | Admitting: Adult Health

## 2024-08-04 NOTE — Telephone Encounter (Signed)
 I spoke with patient and she is scheduled for 09/12/2024 with NP from 08/25/2024.

## 2024-08-09 ENCOUNTER — Telehealth: Payer: Self-pay

## 2024-08-09 NOTE — Telephone Encounter (Signed)
 Error

## 2024-08-10 ENCOUNTER — Telehealth: Payer: Self-pay

## 2024-08-10 NOTE — Telephone Encounter (Signed)
 Patient reports she is currently working from home and does not have an established PCP. She requests a letter stating that returning to an on-site work environment may expose her to conditions she feels could be hazardous to her health. She expresses that continuing remote work is the safest option for her well-being at this time.

## 2024-08-11 ENCOUNTER — Telehealth: Payer: Self-pay

## 2024-08-11 NOTE — Telephone Encounter (Signed)
 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 I would suggest she establish with a primary care provider to explore her general health and this concern.     Thanks,    Morna

## 2024-08-25 ENCOUNTER — Encounter: Payer: Medicaid Other | Admitting: Adult Health

## 2024-09-12 ENCOUNTER — Inpatient Hospital Stay: Admitting: Adult Health
